# Patient Record
Sex: Female | Born: 1970 | Race: Black or African American | Hispanic: No | Marital: Single | Smoking: Never smoker
Health system: Southern US, Community
[De-identification: ages and names within clinical notes are randomized; demographics above are authoritative.]

## PROBLEM LIST (undated history)

## (undated) DIAGNOSIS — J309 Allergic rhinitis, unspecified: Secondary | ICD-10-CM

## (undated) DIAGNOSIS — I10 Essential (primary) hypertension: Secondary | ICD-10-CM

## (undated) DIAGNOSIS — E785 Hyperlipidemia, unspecified: Secondary | ICD-10-CM

## (undated) DIAGNOSIS — T7840XA Allergy, unspecified, initial encounter: Secondary | ICD-10-CM

## (undated) DIAGNOSIS — G4730 Sleep apnea, unspecified: Secondary | ICD-10-CM

## (undated) DIAGNOSIS — R600 Localized edema: Secondary | ICD-10-CM

## (undated) DIAGNOSIS — Z6841 Body Mass Index (BMI) 40.0 and over, adult: Secondary | ICD-10-CM

## (undated) DIAGNOSIS — R739 Hyperglycemia, unspecified: Secondary | ICD-10-CM

## (undated) DIAGNOSIS — R011 Cardiac murmur, unspecified: Secondary | ICD-10-CM

## (undated) DIAGNOSIS — E7800 Pure hypercholesterolemia, unspecified: Secondary | ICD-10-CM

## (undated) HISTORY — DX: Essential (primary) hypertension: I10

## (undated) HISTORY — DX: Cardiac murmur, unspecified: R01.1

## (undated) HISTORY — PX: TONSILLECTOMY: SUR1361

## (undated) HISTORY — DX: Hyperlipidemia, unspecified: E78.5

## (undated) HISTORY — DX: Hyperglycemia, unspecified: R73.9

## (undated) HISTORY — DX: Morbid (severe) obesity due to excess calories: E66.01

## (undated) HISTORY — DX: Localized edema: R60.0

## (undated) HISTORY — DX: Allergy, unspecified, initial encounter: T78.40XA

## (undated) HISTORY — DX: Pure hypercholesterolemia, unspecified: E78.00

## (undated) HISTORY — PX: UTERINE FIBROID SURGERY: SHX826

## (undated) HISTORY — PX: BREAST LUMPECTOMY: SHX2

## (undated) HISTORY — DX: Body Mass Index (BMI) 40.0 and over, adult: Z684

## (undated) HISTORY — PX: PARTIAL HYSTERECTOMY: SHX80

## (undated) HISTORY — DX: Allergic rhinitis, unspecified: J30.9

## (undated) HISTORY — DX: Sleep apnea, unspecified: G47.30

---

## 1999-05-17 ENCOUNTER — Ambulatory Visit (HOSPITAL_COMMUNITY): Admission: RE | Admit: 1999-05-17 | Discharge: 1999-05-17 | Payer: Self-pay | Admitting: General Surgery

## 1999-05-17 ENCOUNTER — Encounter (INDEPENDENT_AMBULATORY_CARE_PROVIDER_SITE_OTHER): Payer: Self-pay

## 1999-05-17 ENCOUNTER — Encounter (HOSPITAL_BASED_OUTPATIENT_CLINIC_OR_DEPARTMENT_OTHER): Payer: Self-pay | Admitting: General Surgery

## 1999-11-29 ENCOUNTER — Encounter (HOSPITAL_BASED_OUTPATIENT_CLINIC_OR_DEPARTMENT_OTHER): Payer: Self-pay | Admitting: General Surgery

## 1999-11-29 ENCOUNTER — Encounter: Admission: RE | Admit: 1999-11-29 | Discharge: 1999-11-29 | Payer: Self-pay | Admitting: General Surgery

## 1999-12-22 ENCOUNTER — Encounter: Admission: RE | Admit: 1999-12-22 | Discharge: 1999-12-22 | Payer: Self-pay | Admitting: General Surgery

## 2001-10-01 ENCOUNTER — Encounter (HOSPITAL_BASED_OUTPATIENT_CLINIC_OR_DEPARTMENT_OTHER): Payer: Self-pay | Admitting: General Surgery

## 2001-10-01 ENCOUNTER — Encounter: Admission: RE | Admit: 2001-10-01 | Discharge: 2001-10-01 | Payer: Self-pay | Admitting: General Surgery

## 2002-03-26 ENCOUNTER — Encounter: Admission: RE | Admit: 2002-03-26 | Discharge: 2002-03-26 | Payer: Self-pay | Admitting: General Surgery

## 2002-03-26 ENCOUNTER — Encounter (HOSPITAL_BASED_OUTPATIENT_CLINIC_OR_DEPARTMENT_OTHER): Payer: Self-pay | Admitting: General Surgery

## 2002-04-01 ENCOUNTER — Encounter: Admission: RE | Admit: 2002-04-01 | Discharge: 2002-04-01 | Payer: Self-pay | Admitting: General Surgery

## 2002-04-01 ENCOUNTER — Encounter (HOSPITAL_BASED_OUTPATIENT_CLINIC_OR_DEPARTMENT_OTHER): Payer: Self-pay | Admitting: General Surgery

## 2002-04-15 ENCOUNTER — Encounter (HOSPITAL_BASED_OUTPATIENT_CLINIC_OR_DEPARTMENT_OTHER): Payer: Self-pay | Admitting: General Surgery

## 2002-04-15 ENCOUNTER — Encounter: Admission: RE | Admit: 2002-04-15 | Discharge: 2002-04-15 | Payer: Self-pay | Admitting: General Surgery

## 2004-06-08 ENCOUNTER — Encounter: Admission: RE | Admit: 2004-06-08 | Discharge: 2004-06-08 | Payer: Self-pay | Admitting: General Surgery

## 2005-06-26 ENCOUNTER — Encounter: Admission: RE | Admit: 2005-06-26 | Discharge: 2005-06-26 | Payer: Self-pay | Admitting: Obstetrics & Gynecology

## 2006-07-08 ENCOUNTER — Encounter: Admission: RE | Admit: 2006-07-08 | Discharge: 2006-07-08 | Payer: Self-pay | Admitting: Family Medicine

## 2006-07-17 ENCOUNTER — Encounter: Admission: RE | Admit: 2006-07-17 | Discharge: 2006-07-17 | Payer: Self-pay | Admitting: Family Medicine

## 2007-01-01 ENCOUNTER — Encounter: Admission: RE | Admit: 2007-01-01 | Discharge: 2007-01-01 | Payer: Self-pay | Admitting: Family Medicine

## 2008-07-02 ENCOUNTER — Encounter: Admission: RE | Admit: 2008-07-02 | Discharge: 2008-07-02 | Payer: Self-pay | Admitting: Family Medicine

## 2009-07-04 ENCOUNTER — Encounter: Admission: RE | Admit: 2009-07-04 | Discharge: 2009-07-04 | Payer: Self-pay | Admitting: Family Medicine

## 2010-03-12 ENCOUNTER — Encounter: Payer: Self-pay | Admitting: Family Medicine

## 2010-06-29 ENCOUNTER — Other Ambulatory Visit: Payer: Self-pay | Admitting: Family Medicine

## 2010-06-29 DIAGNOSIS — Z1231 Encounter for screening mammogram for malignant neoplasm of breast: Secondary | ICD-10-CM

## 2010-07-18 ENCOUNTER — Ambulatory Visit: Payer: Self-pay

## 2010-07-18 ENCOUNTER — Ambulatory Visit
Admission: RE | Admit: 2010-07-18 | Discharge: 2010-07-18 | Disposition: A | Discharge status: Home / Self Care | Payer: PRIVATE HEALTH INSURANCE | Source: Ambulatory Visit | Attending: Family Medicine | Admitting: Family Medicine

## 2010-07-18 DIAGNOSIS — Z1231 Encounter for screening mammogram for malignant neoplasm of breast: Secondary | ICD-10-CM

## 2011-04-06 ENCOUNTER — Other Ambulatory Visit: Payer: Self-pay | Admitting: Family Medicine

## 2011-04-06 DIAGNOSIS — N6310 Unspecified lump in the right breast, unspecified quadrant: Secondary | ICD-10-CM

## 2011-04-09 ENCOUNTER — Ambulatory Visit
Admission: RE | Admit: 2011-04-09 | Discharge: 2011-04-09 | Disposition: A | Discharge status: Home / Self Care | Payer: PRIVATE HEALTH INSURANCE | Source: Ambulatory Visit | Attending: Family Medicine | Admitting: Family Medicine

## 2011-04-09 DIAGNOSIS — N6310 Unspecified lump in the right breast, unspecified quadrant: Secondary | ICD-10-CM

## 2011-05-04 ENCOUNTER — Other Ambulatory Visit: Payer: Self-pay | Admitting: Family Medicine

## 2011-05-04 DIAGNOSIS — N6310 Unspecified lump in the right breast, unspecified quadrant: Secondary | ICD-10-CM

## 2011-05-17 ENCOUNTER — Other Ambulatory Visit: Payer: Self-pay | Admitting: Family Medicine

## 2011-05-17 ENCOUNTER — Ambulatory Visit
Admission: RE | Admit: 2011-05-17 | Discharge: 2011-05-17 | Disposition: A | Discharge status: Home / Self Care | Payer: PRIVATE HEALTH INSURANCE | Source: Ambulatory Visit | Attending: Family Medicine | Admitting: Family Medicine

## 2011-05-17 DIAGNOSIS — N6310 Unspecified lump in the right breast, unspecified quadrant: Secondary | ICD-10-CM

## 2011-07-12 ENCOUNTER — Other Ambulatory Visit: Payer: Self-pay | Admitting: Family Medicine

## 2011-07-12 DIAGNOSIS — Z1231 Encounter for screening mammogram for malignant neoplasm of breast: Secondary | ICD-10-CM

## 2011-07-27 ENCOUNTER — Ambulatory Visit: Payer: PRIVATE HEALTH INSURANCE

## 2011-07-31 ENCOUNTER — Ambulatory Visit: Payer: PRIVATE HEALTH INSURANCE

## 2011-08-03 ENCOUNTER — Ambulatory Visit: Payer: PRIVATE HEALTH INSURANCE

## 2011-08-10 ENCOUNTER — Ambulatory Visit
Admission: RE | Admit: 2011-08-10 | Discharge: 2011-08-10 | Disposition: A | Discharge status: Home / Self Care | Payer: PRIVATE HEALTH INSURANCE | Source: Ambulatory Visit | Attending: Family Medicine | Admitting: Family Medicine

## 2011-08-10 DIAGNOSIS — Z1231 Encounter for screening mammogram for malignant neoplasm of breast: Secondary | ICD-10-CM

## 2012-08-21 ENCOUNTER — Other Ambulatory Visit: Payer: Self-pay

## 2012-08-21 DIAGNOSIS — Z1231 Encounter for screening mammogram for malignant neoplasm of breast: Secondary | ICD-10-CM

## 2012-09-16 ENCOUNTER — Ambulatory Visit
Admission: RE | Admit: 2012-09-16 | Discharge: 2012-09-16 | Disposition: A | Discharge status: Home / Self Care | Payer: 59 | Source: Ambulatory Visit

## 2012-09-16 DIAGNOSIS — Z1231 Encounter for screening mammogram for malignant neoplasm of breast: Secondary | ICD-10-CM

## 2012-09-18 ENCOUNTER — Other Ambulatory Visit: Payer: Self-pay | Admitting: Family Medicine

## 2012-09-18 DIAGNOSIS — R928 Other abnormal and inconclusive findings on diagnostic imaging of breast: Secondary | ICD-10-CM

## 2012-09-23 ENCOUNTER — Ambulatory Visit
Admission: RE | Admit: 2012-09-23 | Discharge: 2012-09-23 | Disposition: A | Discharge status: Home / Self Care | Payer: 59 | Source: Ambulatory Visit | Attending: Family Medicine | Admitting: Family Medicine

## 2012-09-23 DIAGNOSIS — R928 Other abnormal and inconclusive findings on diagnostic imaging of breast: Secondary | ICD-10-CM

## 2012-11-12 ENCOUNTER — Other Ambulatory Visit: Payer: Self-pay | Admitting: Family Medicine

## 2012-11-12 DIAGNOSIS — N63 Unspecified lump in unspecified breast: Secondary | ICD-10-CM

## 2012-11-27 ENCOUNTER — Ambulatory Visit
Admission: RE | Admit: 2012-11-27 | Discharge: 2012-11-27 | Disposition: A | Discharge status: Home / Self Care | Payer: Self-pay | Source: Ambulatory Visit | Attending: Family Medicine | Admitting: Family Medicine

## 2012-11-27 ENCOUNTER — Other Ambulatory Visit: Payer: Self-pay | Admitting: Family Medicine

## 2012-11-27 DIAGNOSIS — N63 Unspecified lump in unspecified breast: Secondary | ICD-10-CM

## 2013-08-24 ENCOUNTER — Other Ambulatory Visit: Payer: Self-pay

## 2013-08-24 DIAGNOSIS — Z1231 Encounter for screening mammogram for malignant neoplasm of breast: Secondary | ICD-10-CM

## 2013-09-17 ENCOUNTER — Ambulatory Visit
Admission: RE | Admit: 2013-09-17 | Discharge: 2013-09-17 | Disposition: A | Discharge status: Home / Self Care | Payer: BC Managed Care – PPO | Source: Ambulatory Visit

## 2013-09-17 DIAGNOSIS — Z1231 Encounter for screening mammogram for malignant neoplasm of breast: Secondary | ICD-10-CM

## 2013-09-22 ENCOUNTER — Other Ambulatory Visit: Payer: Self-pay | Admitting: Family Medicine

## 2013-09-22 DIAGNOSIS — R928 Other abnormal and inconclusive findings on diagnostic imaging of breast: Secondary | ICD-10-CM

## 2013-09-30 ENCOUNTER — Other Ambulatory Visit: Payer: BC Managed Care – PPO

## 2013-10-01 ENCOUNTER — Other Ambulatory Visit: Payer: BC Managed Care – PPO

## 2013-10-02 ENCOUNTER — Ambulatory Visit
Admission: RE | Admit: 2013-10-02 | Discharge: 2013-10-02 | Disposition: A | Discharge status: Home / Self Care | Payer: BC Managed Care – PPO | Source: Ambulatory Visit | Attending: Family Medicine | Admitting: Family Medicine

## 2013-10-02 DIAGNOSIS — R928 Other abnormal and inconclusive findings on diagnostic imaging of breast: Secondary | ICD-10-CM

## 2014-02-24 ENCOUNTER — Other Ambulatory Visit: Payer: Self-pay | Admitting: Family Medicine

## 2014-02-24 DIAGNOSIS — N63 Unspecified lump in unspecified breast: Secondary | ICD-10-CM

## 2014-04-05 ENCOUNTER — Other Ambulatory Visit: Payer: Self-pay

## 2014-04-08 ENCOUNTER — Ambulatory Visit
Admission: RE | Admit: 2014-04-08 | Discharge: 2014-04-08 | Disposition: A | Discharge status: Home / Self Care | Payer: BLUE CROSS/BLUE SHIELD | Source: Ambulatory Visit | Attending: Family Medicine | Admitting: Family Medicine

## 2014-04-08 DIAGNOSIS — N63 Unspecified lump in unspecified breast: Secondary | ICD-10-CM

## 2014-04-12 ENCOUNTER — Other Ambulatory Visit: Payer: Self-pay

## 2014-09-21 ENCOUNTER — Other Ambulatory Visit: Payer: Self-pay

## 2014-09-21 ENCOUNTER — Other Ambulatory Visit: Payer: Self-pay | Admitting: Family Medicine

## 2014-09-21 DIAGNOSIS — N63 Unspecified lump in unspecified breast: Secondary | ICD-10-CM

## 2014-09-30 IMAGING — MG MM DIGITAL DIAGNOSTIC UNILAT*R*
2 series · 2 of 2 positions shown · non-contrast
Comparison: Previous exams

CLINICAL DATA: Status post ultrasound-guided core biopsy of right
breast mass, 8 o'clock location. Clip placement images.

EXAM:
DIGITAL DIAGNOSTIC UNILATERAL RIGHT MAMMOGRAM

[R ML]
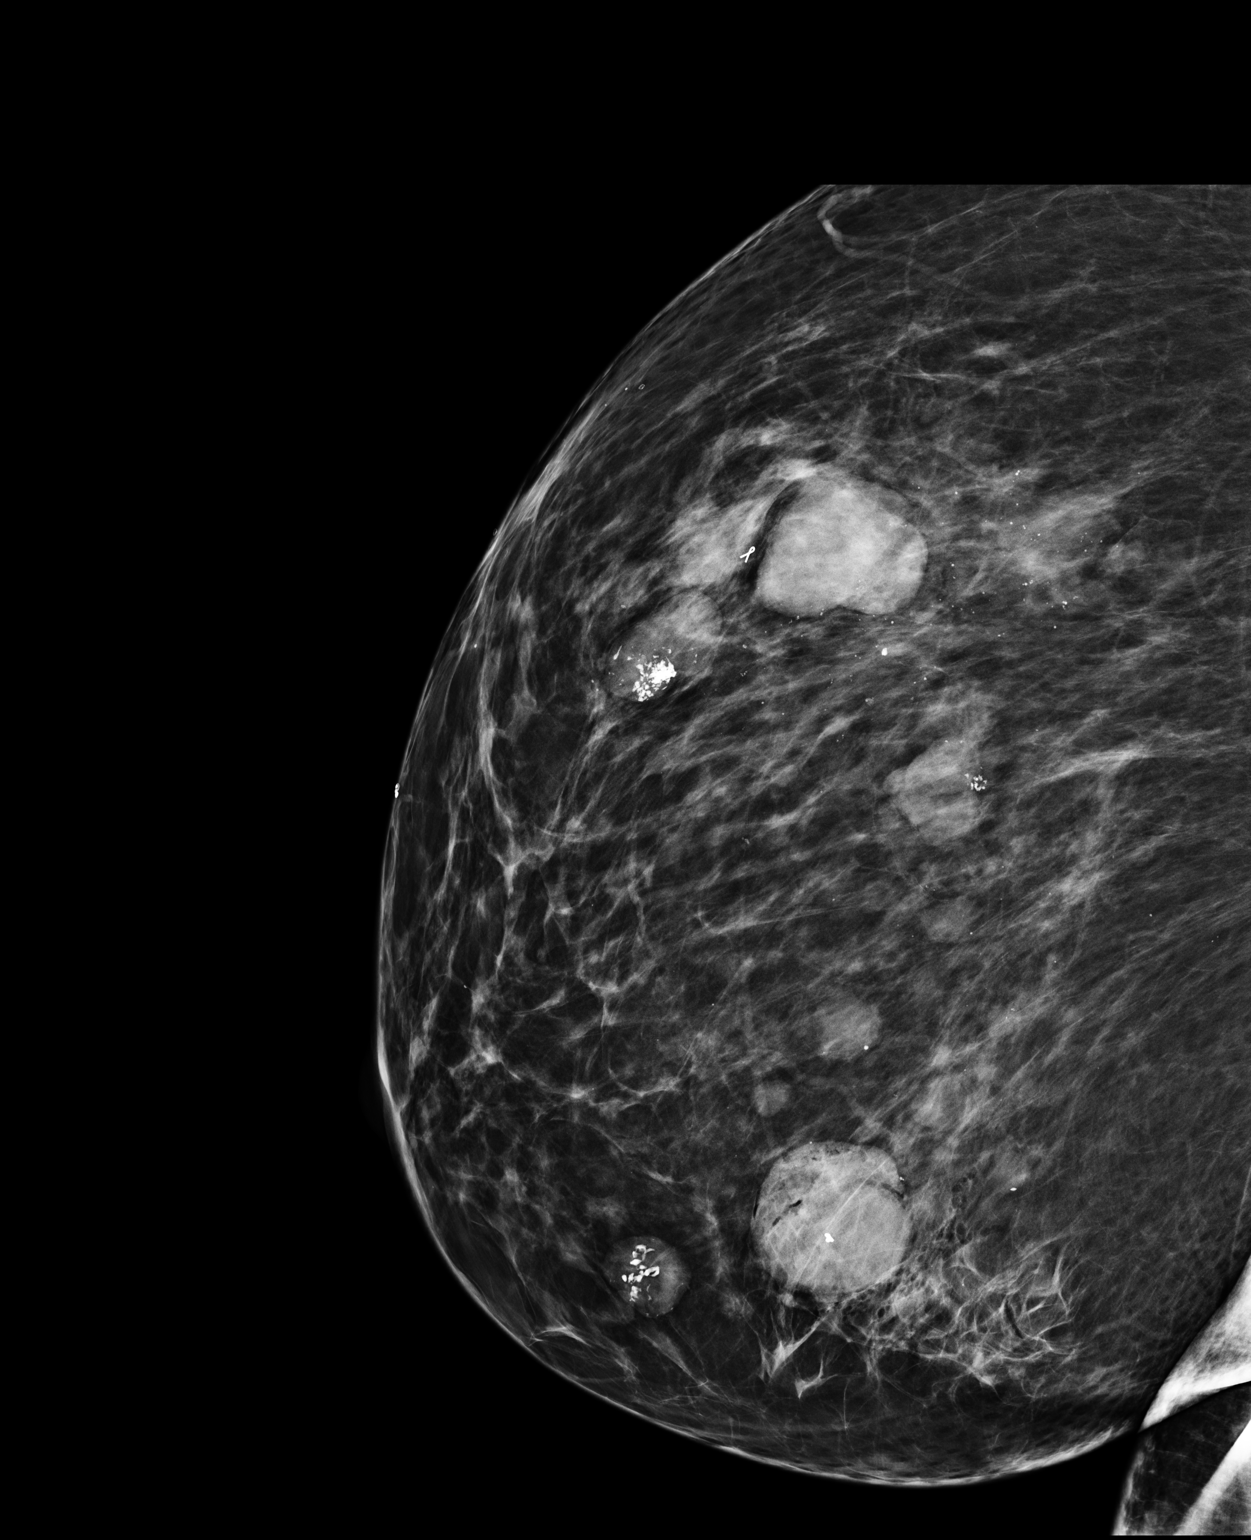

[R CC]
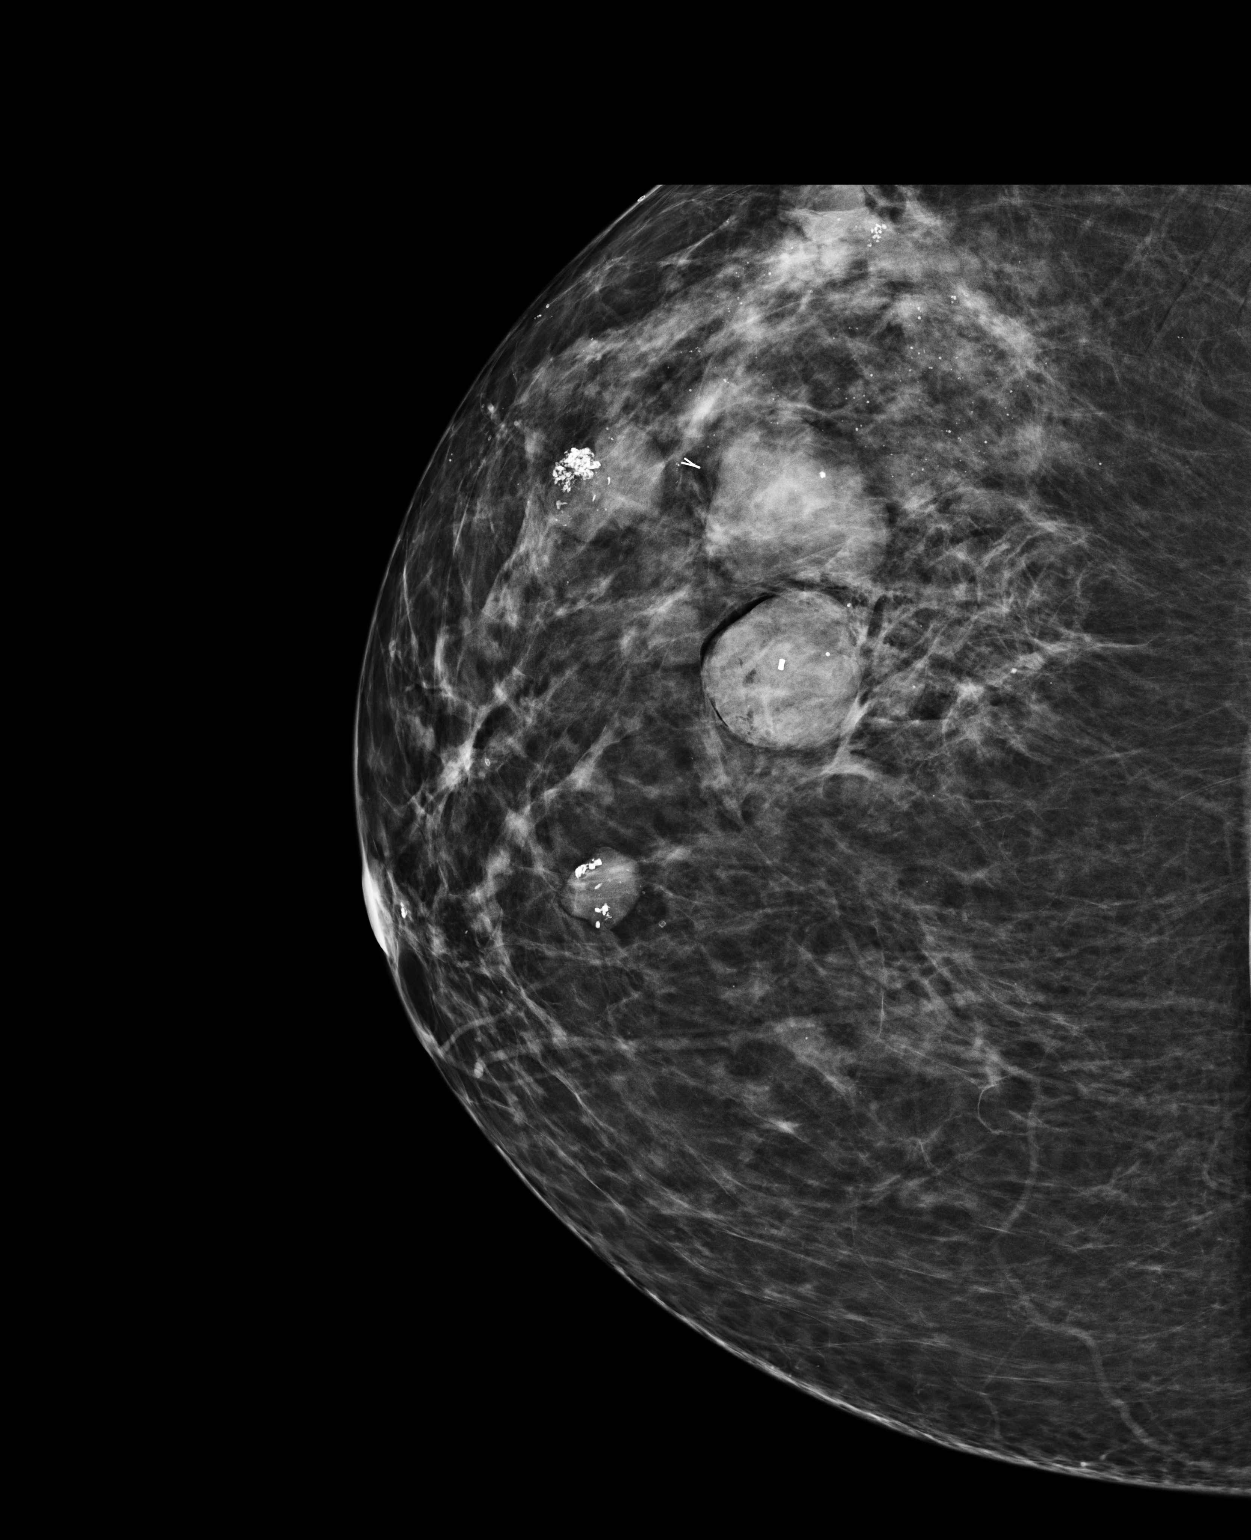

[2 of 2 positions shown; findings below may reference images not displayed]

FINDINGS: Mammographic images were obtained following ultrasound guided biopsy
of mass in the o'clock location of the right breast. A top hat
shaped clip is identified within the mass in the lower outer
quadrant of the right breast..
IMPRESSION: Tissue marker clip is in expected location following biopsy.  Clear

Final Assessment: Post Procedure Mammograms for Marker Placement

## 2014-10-01 ENCOUNTER — Other Ambulatory Visit: Payer: BLUE CROSS/BLUE SHIELD

## 2015-06-09 ENCOUNTER — Other Ambulatory Visit: Payer: Self-pay

## 2015-06-09 DIAGNOSIS — D242 Benign neoplasm of left breast: Secondary | ICD-10-CM | POA: Diagnosis not present

## 2015-06-09 DIAGNOSIS — N63 Unspecified lump in breast: Secondary | ICD-10-CM | POA: Diagnosis not present

## 2015-06-14 DIAGNOSIS — F209 Schizophrenia, unspecified: Secondary | ICD-10-CM | POA: Diagnosis not present

## 2015-07-12 DIAGNOSIS — F209 Schizophrenia, unspecified: Secondary | ICD-10-CM | POA: Diagnosis not present

## 2015-08-05 IMAGING — US US BREAST LTD UNI RIGHT INC AXILLA
1 series · 5 of 5 positions shown · non-contrast
Comparison: Multiple priors, most recent studies include screening
mammogram/tomogram 09/17/2013, 11/27/2012 post clip mammogram of the
right breast, right breast ultrasound-guided biopsy, and 09/16/2012
screening mammogram/tomogram.

CLINICAL DATA: Patient recently had screening mammogram/ tomogram
dated 09/17/2013. A mass in 8 o'clock position of the right breast
biopsied in November 2012 inch shown to be a fibroadenoma at
pathology contains a biopsy clip centrally. This mass appears larger
on the current screening mammogram/tomogram dated 09/17/2013
compared to the mammogram August 2012. The patient states she has
noticed that this mass has enlarged since it was biopsied.

[Series 1: us breast ltd uni right inc axilla · 5 of 5 slices shown]
[im 1/5]
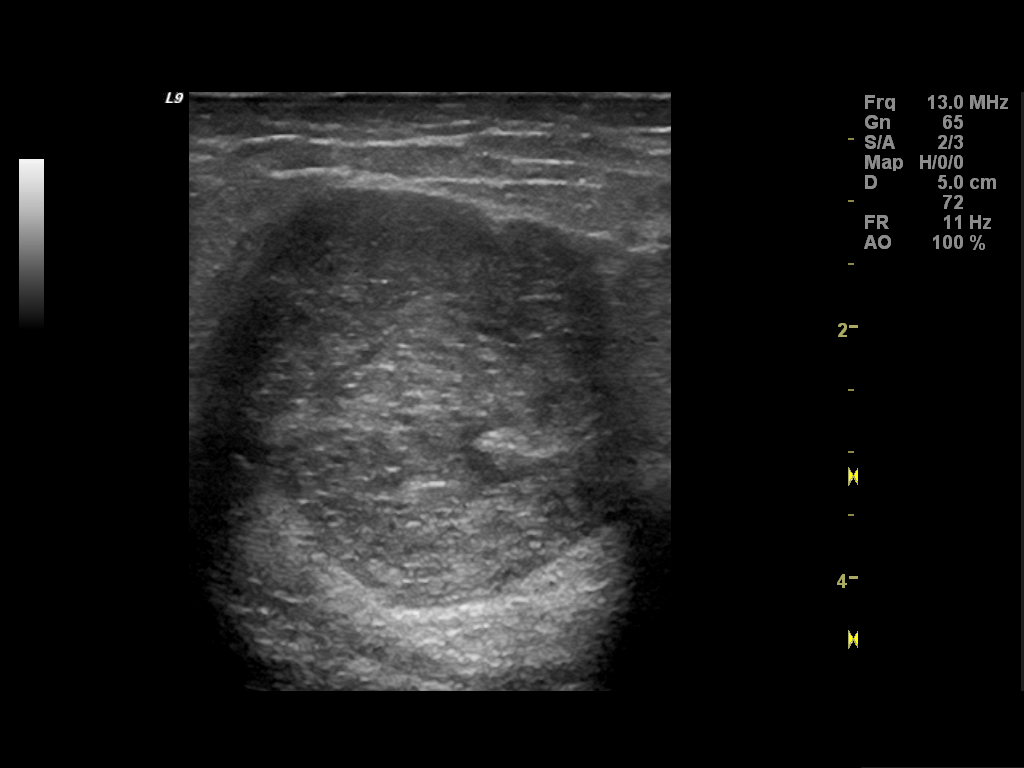
[im 2/5]
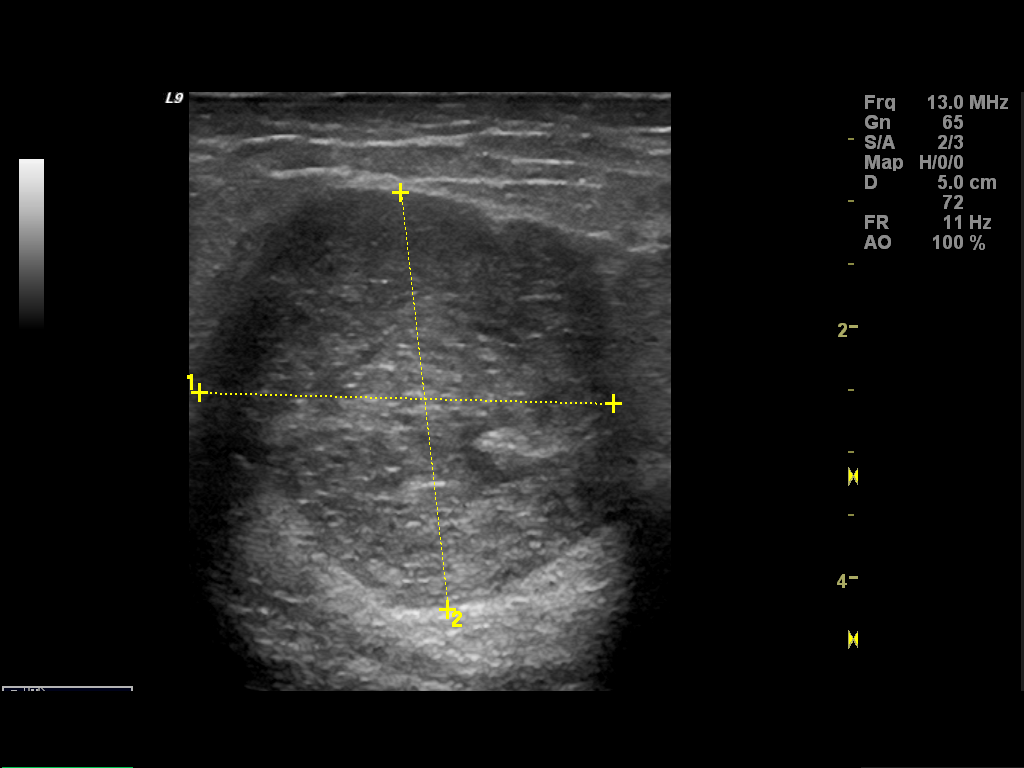
[im 3/5]
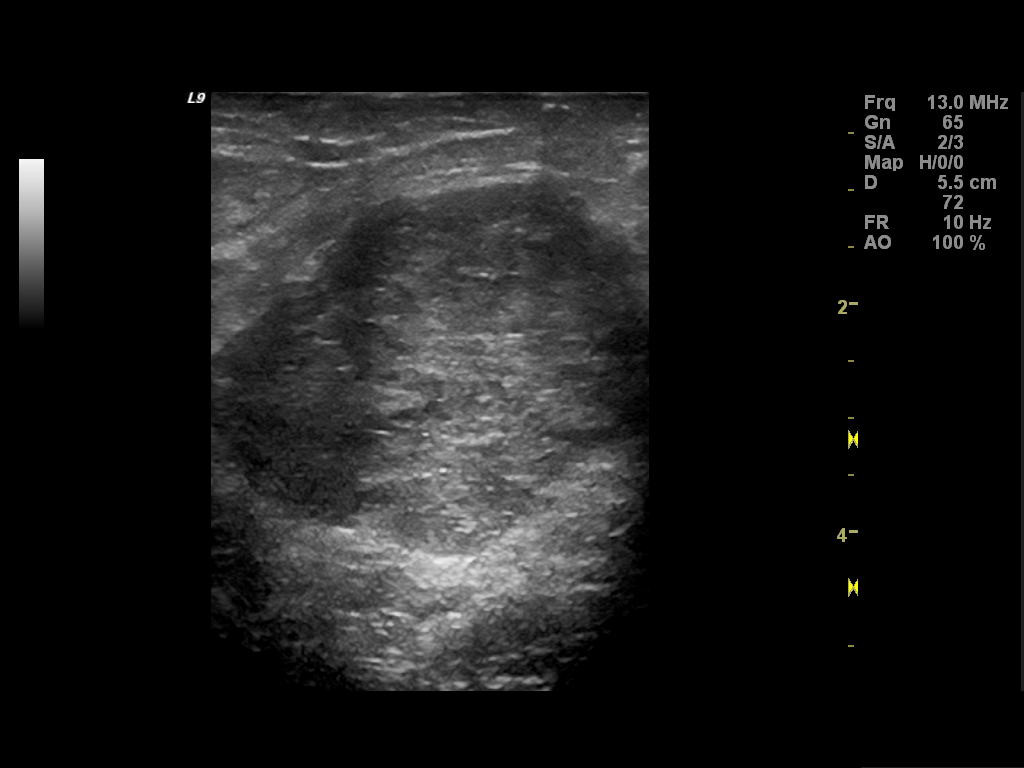
[im 4/5]
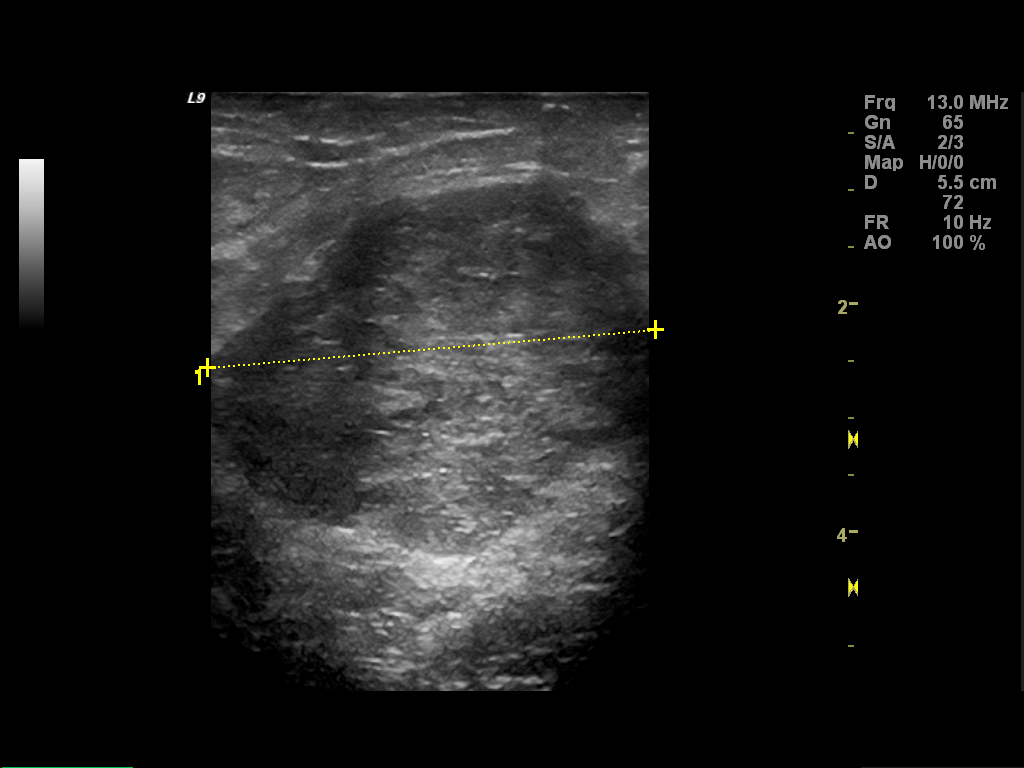
[im 5/5]
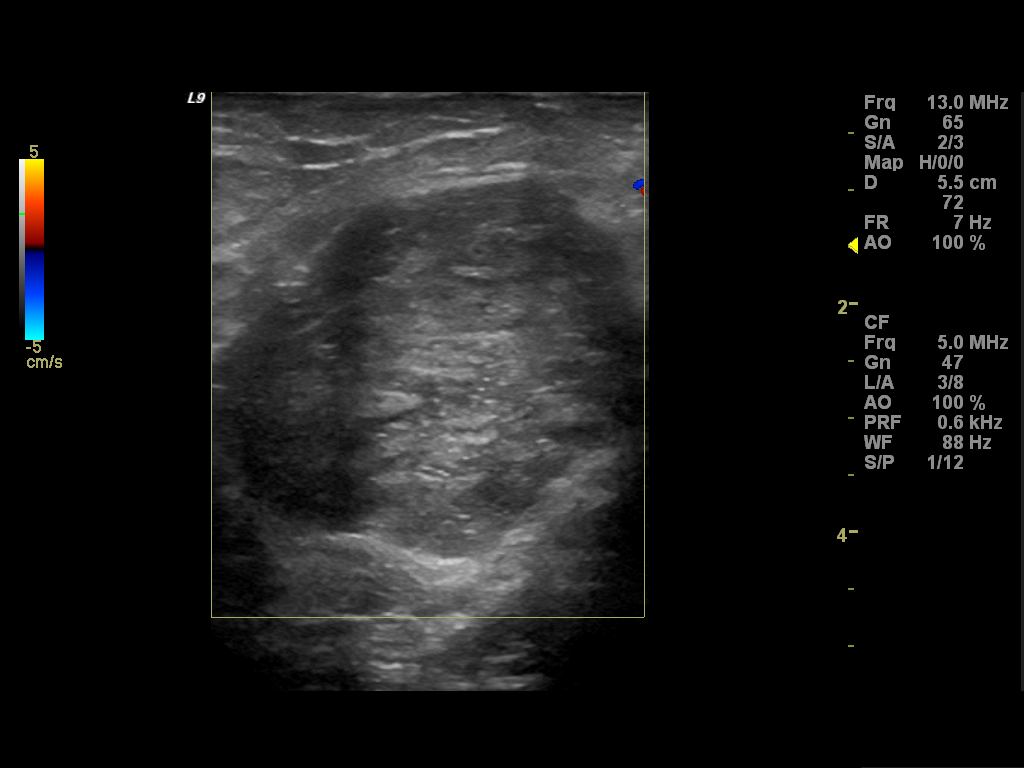

[5 of 5 positions shown; findings below may reference images not displayed]

Additionally, there is a new mass in the central left breast,
posterior third, for which additional evaluation is requested.

Patient's recent screening mammogram/tomogram also shows multiple
bilateral masses that have associated markedly dystrophic
calcifications, consistent with a degenerating fibroadenomas.

She had a right breast mass biopsied at the 11 o'clock position in
March 2011, also revealing a fibroadenoma.

Discussion with the patient today, she reports a history of multiple
fibroadenomas, beginning when she was teenager. She states she has
had fibroadenomas surgically excised from each breast, in her 20s to
early 30s.

EXAM:
ULTRASOUND OF THE BILATERAL BREAST
FINDINGS: On physical exam, I palpate a mobile mass at 8 o'clock position
approximately 6 cm from the nipple. No mass is palpated in the
central left breast.

Ultrasound is performed, showing a circumscribed slightly
hypoechoic, homogeneous, mass that is gently lobulated in the 8
o'clock position of the right breast 6 cm from the nipple. The mass
measures 3.9 x 3.3 x 3.3 cm on ultrasound. On the recent screening
mammogram/tomogram, this mass remains circumscribed and contains
central biopsy clip artifact. The mass has enlarged. By ultrasound
performed 11/27/2012 it measured 2.1 x 1.9 x 1.8 cm. No significant
vascular flow is seen within the mass on today's ultrasound. Imaging
features remain most compatible with a fibroadenoma.

Ultrasound of the 12 o'clock retroareolar position left breast shows
a hypoechoic circumscribed oval mass measuring 2.8 x 0.9 x 2.3 cm.
This has imaging findings most compatible with a benign
fibroadenoma. On the recent screening mammogram/ tomogram, it has
similar appearances to the patient's multiple bilateral
circumscribed masses.
IMPRESSION: 1. The patient has a biopsy-proven fibroadenoma in the 8 o'clock
position of the right breast that has enlarged since the biopsy November 2012. The patient has an extensive history of bilateral
fibroadenomas. [place] was discussed with the patient today that this
is most likely of benign fibroadenoma that has enlarged in the
interval. The possibility of surgical excision was discussed with
the patient, but she prefers ultrasound followup.
2. New circumscribed oval mass 12 o'clock retroareolar left breast
has imaging features compatible with benign fibroadenoma. Six-month
followup left breast ultrasound is recommended.

RECOMMENDATION:
Bilateral breast ultrasound in 6 months is recommended.

I have discussed the findings and recommendations with the patient.
Results were also provided in writing at the conclusion of the
visit. If applicable, a reminder letter will be sent to the patient
regarding the next appointment.

BI-RADS CATEGORY  3: Probably benign.

## 2015-08-09 DIAGNOSIS — F209 Schizophrenia, unspecified: Secondary | ICD-10-CM | POA: Diagnosis not present

## 2015-09-06 DIAGNOSIS — F209 Schizophrenia, unspecified: Secondary | ICD-10-CM | POA: Diagnosis not present

## 2015-09-26 DIAGNOSIS — D649 Anemia, unspecified: Secondary | ICD-10-CM | POA: Diagnosis not present

## 2015-09-26 DIAGNOSIS — I10 Essential (primary) hypertension: Secondary | ICD-10-CM | POA: Diagnosis not present

## 2015-09-26 DIAGNOSIS — Z79899 Other long term (current) drug therapy: Secondary | ICD-10-CM | POA: Diagnosis not present

## 2015-09-26 DIAGNOSIS — E7800 Pure hypercholesterolemia, unspecified: Secondary | ICD-10-CM | POA: Diagnosis not present

## 2015-09-29 DIAGNOSIS — I10 Essential (primary) hypertension: Secondary | ICD-10-CM | POA: Diagnosis not present

## 2015-09-29 DIAGNOSIS — F209 Schizophrenia, unspecified: Secondary | ICD-10-CM | POA: Diagnosis not present

## 2015-09-29 DIAGNOSIS — E7800 Pure hypercholesterolemia, unspecified: Secondary | ICD-10-CM | POA: Diagnosis not present

## 2015-10-04 DIAGNOSIS — F209 Schizophrenia, unspecified: Secondary | ICD-10-CM | POA: Diagnosis not present

## 2015-10-13 DIAGNOSIS — Z1231 Encounter for screening mammogram for malignant neoplasm of breast: Secondary | ICD-10-CM | POA: Diagnosis not present

## 2015-11-01 DIAGNOSIS — F209 Schizophrenia, unspecified: Secondary | ICD-10-CM | POA: Diagnosis not present

## 2015-11-29 DIAGNOSIS — F209 Schizophrenia, unspecified: Secondary | ICD-10-CM | POA: Diagnosis not present

## 2015-12-12 DIAGNOSIS — D242 Benign neoplasm of left breast: Secondary | ICD-10-CM | POA: Diagnosis not present

## 2015-12-12 DIAGNOSIS — R921 Mammographic calcification found on diagnostic imaging of breast: Secondary | ICD-10-CM | POA: Diagnosis not present

## 2015-12-12 DIAGNOSIS — D241 Benign neoplasm of right breast: Secondary | ICD-10-CM | POA: Diagnosis not present

## 2015-12-27 DIAGNOSIS — F209 Schizophrenia, unspecified: Secondary | ICD-10-CM | POA: Diagnosis not present

## 2016-01-05 DIAGNOSIS — I10 Essential (primary) hypertension: Secondary | ICD-10-CM | POA: Diagnosis not present

## 2016-01-05 DIAGNOSIS — Z0000 Encounter for general adult medical examination without abnormal findings: Secondary | ICD-10-CM | POA: Diagnosis not present

## 2016-01-05 DIAGNOSIS — E7800 Pure hypercholesterolemia, unspecified: Secondary | ICD-10-CM | POA: Diagnosis not present

## 2016-01-10 DIAGNOSIS — Z23 Encounter for immunization: Secondary | ICD-10-CM | POA: Diagnosis not present

## 2016-01-24 DIAGNOSIS — F209 Schizophrenia, unspecified: Secondary | ICD-10-CM | POA: Diagnosis not present

## 2016-02-09 IMAGING — US US BREAST LTD UNI RIGHT INC AXILLA
1 series · 4 of 4 positions shown · non-contrast
Comparison: Previous exam(s).

CLINICAL DATA: Six-month followup bilateral masses

EXAM:
ULTRASOUND OF THE BILATERAL BREAST

[Series 1: us breast ltd uni right inc axilla · 4 of 4 slices shown]
[im 1/4]
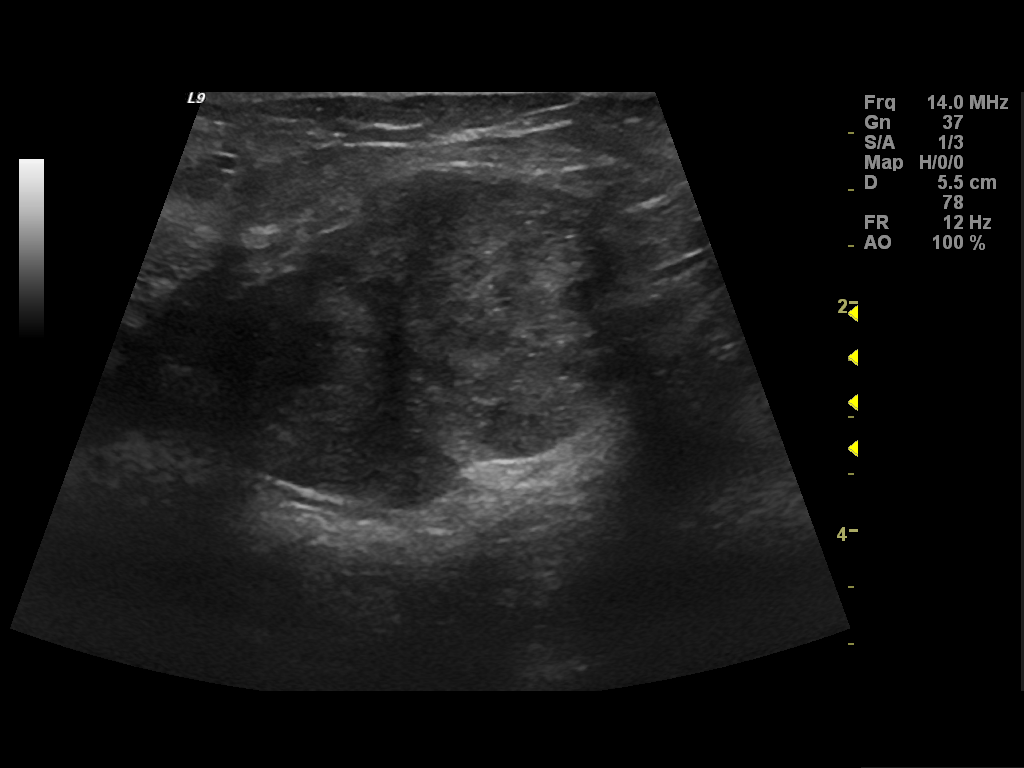
[im 2/4]
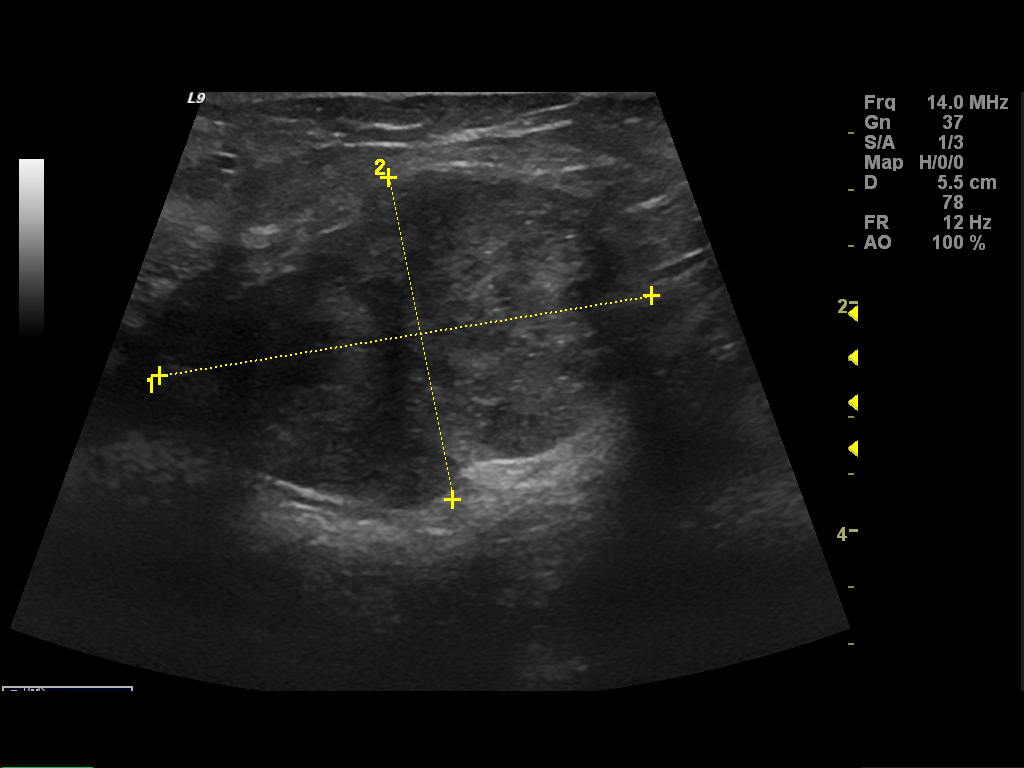
[im 3/4]
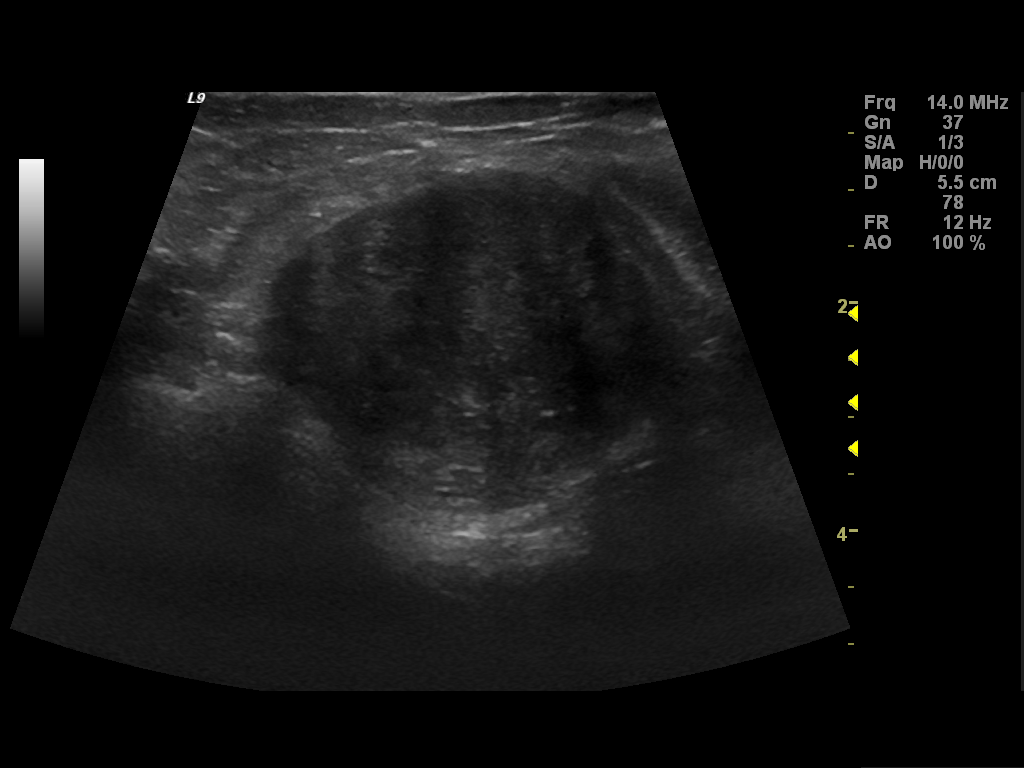
[im 4/4]
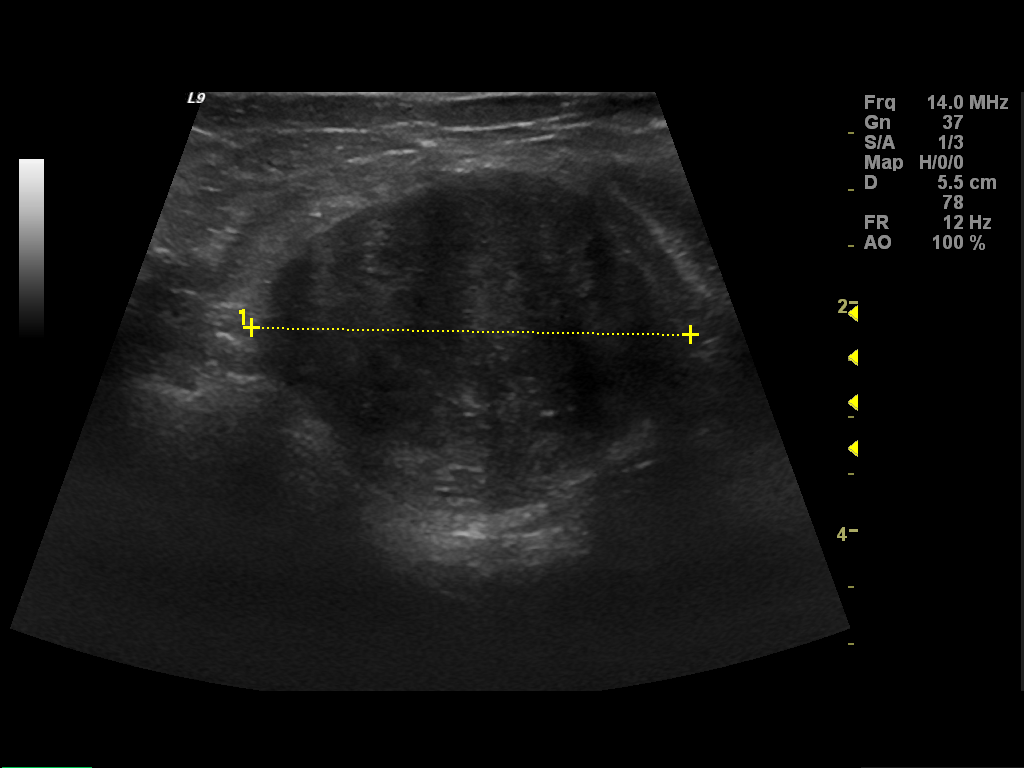

[4 of 4 positions shown; findings below may reference images not displayed]

FINDINGS: Targeted ultrasound is performed, showing 4.37 x 2.9 x 3.9 cm oval
hypoechoic lesion at the right breast 8 o'clock 5 cm from nipple
enlarged compared to prior ultrasound. This mass has been previously
biopsied but based on the mammograms has continued to enlarge since
1276.

Ultrasound of the left breast demonstrate 2.1 x 1.4 x 2 cm oval
hypoechoic lesion at the left breast 12 o'clock 1 cm from nipple not
significantly changed compared to prior ultrasound September 2013.
IMPRESSION: Suspicious findings.

RECOMMENDATION:
Surgical removal/excision of the right breast 8 o'clock mass due to
the growth of the mass. Also recommend six-month follow-up
ultrasound of the left breast 12 o'clock mass.

I have discussed the findings and recommendations with the patient.
Results were also provided in writing at the conclusion of the
visit. If applicable, a reminder letter will be sent to the patient
regarding the next appointment.

BI-RADS CATEGORY  4: Suspicious abnormality - biopsy should be
considered.

## 2016-02-21 DIAGNOSIS — F209 Schizophrenia, unspecified: Secondary | ICD-10-CM | POA: Diagnosis not present

## 2016-03-20 DIAGNOSIS — F209 Schizophrenia, unspecified: Secondary | ICD-10-CM | POA: Diagnosis not present

## 2016-04-17 DIAGNOSIS — F209 Schizophrenia, unspecified: Secondary | ICD-10-CM | POA: Diagnosis not present

## 2016-05-15 DIAGNOSIS — F209 Schizophrenia, unspecified: Secondary | ICD-10-CM | POA: Diagnosis not present

## 2016-05-23 DIAGNOSIS — E7800 Pure hypercholesterolemia, unspecified: Secondary | ICD-10-CM | POA: Diagnosis not present

## 2016-05-23 DIAGNOSIS — Z79899 Other long term (current) drug therapy: Secondary | ICD-10-CM | POA: Diagnosis not present

## 2016-05-23 DIAGNOSIS — D649 Anemia, unspecified: Secondary | ICD-10-CM | POA: Diagnosis not present

## 2016-05-23 DIAGNOSIS — I10 Essential (primary) hypertension: Secondary | ICD-10-CM | POA: Diagnosis not present

## 2016-05-24 DIAGNOSIS — F209 Schizophrenia, unspecified: Secondary | ICD-10-CM | POA: Diagnosis not present

## 2016-05-24 DIAGNOSIS — E7800 Pure hypercholesterolemia, unspecified: Secondary | ICD-10-CM | POA: Diagnosis not present

## 2016-05-24 DIAGNOSIS — I10 Essential (primary) hypertension: Secondary | ICD-10-CM | POA: Diagnosis not present

## 2016-05-24 DIAGNOSIS — K219 Gastro-esophageal reflux disease without esophagitis: Secondary | ICD-10-CM | POA: Diagnosis not present

## 2016-06-12 DIAGNOSIS — F209 Schizophrenia, unspecified: Secondary | ICD-10-CM | POA: Diagnosis not present

## 2016-06-13 DIAGNOSIS — R928 Other abnormal and inconclusive findings on diagnostic imaging of breast: Secondary | ICD-10-CM | POA: Diagnosis not present

## 2016-06-28 DIAGNOSIS — Z90710 Acquired absence of both cervix and uterus: Secondary | ICD-10-CM | POA: Diagnosis not present

## 2016-07-10 DIAGNOSIS — F209 Schizophrenia, unspecified: Secondary | ICD-10-CM | POA: Diagnosis not present

## 2016-08-07 DIAGNOSIS — F209 Schizophrenia, unspecified: Secondary | ICD-10-CM | POA: Diagnosis not present

## 2016-09-04 DIAGNOSIS — F209 Schizophrenia, unspecified: Secondary | ICD-10-CM | POA: Diagnosis not present

## 2016-10-02 DIAGNOSIS — F209 Schizophrenia, unspecified: Secondary | ICD-10-CM | POA: Diagnosis not present

## 2016-10-17 DIAGNOSIS — R928 Other abnormal and inconclusive findings on diagnostic imaging of breast: Secondary | ICD-10-CM | POA: Diagnosis not present

## 2016-10-30 DIAGNOSIS — F209 Schizophrenia, unspecified: Secondary | ICD-10-CM | POA: Diagnosis not present

## 2016-11-27 DIAGNOSIS — F209 Schizophrenia, unspecified: Secondary | ICD-10-CM | POA: Diagnosis not present

## 2016-12-05 DIAGNOSIS — J302 Other seasonal allergic rhinitis: Secondary | ICD-10-CM | POA: Diagnosis not present

## 2016-12-05 DIAGNOSIS — J3089 Other allergic rhinitis: Secondary | ICD-10-CM | POA: Diagnosis not present

## 2016-12-25 DIAGNOSIS — F209 Schizophrenia, unspecified: Secondary | ICD-10-CM | POA: Diagnosis not present

## 2017-01-08 DIAGNOSIS — Z79899 Other long term (current) drug therapy: Secondary | ICD-10-CM | POA: Diagnosis not present

## 2017-01-08 DIAGNOSIS — I10 Essential (primary) hypertension: Secondary | ICD-10-CM | POA: Diagnosis not present

## 2017-01-08 DIAGNOSIS — E7800 Pure hypercholesterolemia, unspecified: Secondary | ICD-10-CM | POA: Diagnosis not present

## 2017-01-14 DIAGNOSIS — F209 Schizophrenia, unspecified: Secondary | ICD-10-CM | POA: Diagnosis not present

## 2017-01-14 DIAGNOSIS — E7800 Pure hypercholesterolemia, unspecified: Secondary | ICD-10-CM | POA: Diagnosis not present

## 2017-01-14 DIAGNOSIS — I10 Essential (primary) hypertension: Secondary | ICD-10-CM | POA: Diagnosis not present

## 2017-01-22 DIAGNOSIS — F209 Schizophrenia, unspecified: Secondary | ICD-10-CM | POA: Diagnosis not present

## 2017-02-20 DIAGNOSIS — F209 Schizophrenia, unspecified: Secondary | ICD-10-CM | POA: Diagnosis not present

## 2017-03-19 DIAGNOSIS — F209 Schizophrenia, unspecified: Secondary | ICD-10-CM | POA: Diagnosis not present

## 2017-04-16 DIAGNOSIS — F209 Schizophrenia, unspecified: Secondary | ICD-10-CM | POA: Diagnosis not present

## 2017-05-14 DIAGNOSIS — F209 Schizophrenia, unspecified: Secondary | ICD-10-CM | POA: Diagnosis not present

## 2017-06-04 DIAGNOSIS — K219 Gastro-esophageal reflux disease without esophagitis: Secondary | ICD-10-CM | POA: Diagnosis not present

## 2017-06-04 DIAGNOSIS — Z79899 Other long term (current) drug therapy: Secondary | ICD-10-CM | POA: Diagnosis not present

## 2017-06-04 DIAGNOSIS — I10 Essential (primary) hypertension: Secondary | ICD-10-CM | POA: Diagnosis not present

## 2017-06-04 DIAGNOSIS — E7800 Pure hypercholesterolemia, unspecified: Secondary | ICD-10-CM | POA: Diagnosis not present

## 2017-06-11 DIAGNOSIS — K219 Gastro-esophageal reflux disease without esophagitis: Secondary | ICD-10-CM | POA: Diagnosis not present

## 2017-06-11 DIAGNOSIS — Z79899 Other long term (current) drug therapy: Secondary | ICD-10-CM | POA: Diagnosis not present

## 2017-06-11 DIAGNOSIS — I10 Essential (primary) hypertension: Secondary | ICD-10-CM | POA: Diagnosis not present

## 2017-06-11 DIAGNOSIS — E7800 Pure hypercholesterolemia, unspecified: Secondary | ICD-10-CM | POA: Diagnosis not present

## 2017-07-09 DIAGNOSIS — F209 Schizophrenia, unspecified: Secondary | ICD-10-CM | POA: Diagnosis not present

## 2017-08-06 DIAGNOSIS — F209 Schizophrenia, unspecified: Secondary | ICD-10-CM | POA: Diagnosis not present

## 2017-09-03 DIAGNOSIS — F209 Schizophrenia, unspecified: Secondary | ICD-10-CM | POA: Diagnosis not present

## 2017-11-27 DIAGNOSIS — R928 Other abnormal and inconclusive findings on diagnostic imaging of breast: Secondary | ICD-10-CM | POA: Diagnosis not present

## 2017-11-27 DIAGNOSIS — R921 Mammographic calcification found on diagnostic imaging of breast: Secondary | ICD-10-CM | POA: Diagnosis not present

## 2017-11-27 DIAGNOSIS — Z23 Encounter for immunization: Secondary | ICD-10-CM | POA: Diagnosis not present

## 2017-12-10 DIAGNOSIS — Z79899 Other long term (current) drug therapy: Secondary | ICD-10-CM | POA: Diagnosis not present

## 2017-12-10 DIAGNOSIS — E7800 Pure hypercholesterolemia, unspecified: Secondary | ICD-10-CM | POA: Diagnosis not present

## 2017-12-10 DIAGNOSIS — I10 Essential (primary) hypertension: Secondary | ICD-10-CM | POA: Diagnosis not present

## 2017-12-12 DIAGNOSIS — E7800 Pure hypercholesterolemia, unspecified: Secondary | ICD-10-CM | POA: Diagnosis not present

## 2017-12-12 DIAGNOSIS — I10 Essential (primary) hypertension: Secondary | ICD-10-CM | POA: Diagnosis not present

## 2017-12-12 DIAGNOSIS — F259 Schizoaffective disorder, unspecified: Secondary | ICD-10-CM | POA: Diagnosis not present

## 2017-12-24 DIAGNOSIS — F259 Schizoaffective disorder, unspecified: Secondary | ICD-10-CM | POA: Diagnosis not present

## 2018-01-22 DIAGNOSIS — F209 Schizophrenia, unspecified: Secondary | ICD-10-CM | POA: Diagnosis not present

## 2018-02-18 DIAGNOSIS — F209 Schizophrenia, unspecified: Secondary | ICD-10-CM | POA: Diagnosis not present

## 2018-03-18 DIAGNOSIS — F259 Schizoaffective disorder, unspecified: Secondary | ICD-10-CM | POA: Diagnosis not present

## 2018-04-15 DIAGNOSIS — F209 Schizophrenia, unspecified: Secondary | ICD-10-CM | POA: Diagnosis not present

## 2018-05-13 DIAGNOSIS — F259 Schizoaffective disorder, unspecified: Secondary | ICD-10-CM | POA: Diagnosis not present

## 2018-05-27 DIAGNOSIS — F209 Schizophrenia, unspecified: Secondary | ICD-10-CM | POA: Diagnosis not present

## 2018-05-27 DIAGNOSIS — E7800 Pure hypercholesterolemia, unspecified: Secondary | ICD-10-CM | POA: Diagnosis not present

## 2018-05-27 DIAGNOSIS — I10 Essential (primary) hypertension: Secondary | ICD-10-CM | POA: Diagnosis not present

## 2018-05-27 DIAGNOSIS — Z6841 Body Mass Index (BMI) 40.0 and over, adult: Secondary | ICD-10-CM | POA: Diagnosis not present

## 2018-06-10 DIAGNOSIS — F259 Schizoaffective disorder, unspecified: Secondary | ICD-10-CM | POA: Diagnosis not present

## 2018-07-08 DIAGNOSIS — F209 Schizophrenia, unspecified: Secondary | ICD-10-CM | POA: Diagnosis not present

## 2018-08-05 DIAGNOSIS — F259 Schizoaffective disorder, unspecified: Secondary | ICD-10-CM | POA: Diagnosis not present

## 2018-09-02 DIAGNOSIS — F209 Schizophrenia, unspecified: Secondary | ICD-10-CM | POA: Diagnosis not present

## 2018-09-05 DIAGNOSIS — F209 Schizophrenia, unspecified: Secondary | ICD-10-CM | POA: Diagnosis not present

## 2018-09-05 DIAGNOSIS — E7800 Pure hypercholesterolemia, unspecified: Secondary | ICD-10-CM | POA: Diagnosis not present

## 2018-09-05 DIAGNOSIS — Z1389 Encounter for screening for other disorder: Secondary | ICD-10-CM | POA: Diagnosis not present

## 2018-09-05 DIAGNOSIS — Z0000 Encounter for general adult medical examination without abnormal findings: Secondary | ICD-10-CM | POA: Diagnosis not present

## 2018-09-05 DIAGNOSIS — Z6841 Body Mass Index (BMI) 40.0 and over, adult: Secondary | ICD-10-CM | POA: Diagnosis not present

## 2018-09-05 DIAGNOSIS — I10 Essential (primary) hypertension: Secondary | ICD-10-CM | POA: Diagnosis not present

## 2018-09-05 DIAGNOSIS — Z90710 Acquired absence of both cervix and uterus: Secondary | ICD-10-CM | POA: Diagnosis not present

## 2018-09-30 DIAGNOSIS — F209 Schizophrenia, unspecified: Secondary | ICD-10-CM | POA: Diagnosis not present

## 2018-10-15 DIAGNOSIS — K130 Diseases of lips: Secondary | ICD-10-CM | POA: Diagnosis not present

## 2018-10-28 DIAGNOSIS — F209 Schizophrenia, unspecified: Secondary | ICD-10-CM | POA: Diagnosis not present

## 2018-10-29 DIAGNOSIS — E782 Mixed hyperlipidemia: Secondary | ICD-10-CM | POA: Diagnosis not present

## 2018-10-29 DIAGNOSIS — I10 Essential (primary) hypertension: Secondary | ICD-10-CM | POA: Diagnosis not present

## 2018-10-29 DIAGNOSIS — F209 Schizophrenia, unspecified: Secondary | ICD-10-CM | POA: Diagnosis not present

## 2018-10-29 DIAGNOSIS — F952 Tourette's disorder: Secondary | ICD-10-CM | POA: Diagnosis not present

## 2018-11-25 DIAGNOSIS — F952 Tourette's disorder: Secondary | ICD-10-CM | POA: Diagnosis not present

## 2018-12-09 DIAGNOSIS — Z1231 Encounter for screening mammogram for malignant neoplasm of breast: Secondary | ICD-10-CM | POA: Diagnosis not present

## 2018-12-23 DIAGNOSIS — F209 Schizophrenia, unspecified: Secondary | ICD-10-CM | POA: Diagnosis not present

## 2019-01-09 DIAGNOSIS — Z1211 Encounter for screening for malignant neoplasm of colon: Secondary | ICD-10-CM | POA: Diagnosis not present

## 2019-01-20 DIAGNOSIS — F209 Schizophrenia, unspecified: Secondary | ICD-10-CM | POA: Diagnosis not present

## 2019-02-17 DIAGNOSIS — F209 Schizophrenia, unspecified: Secondary | ICD-10-CM | POA: Diagnosis not present

## 2019-02-23 DIAGNOSIS — Z20828 Contact with and (suspected) exposure to other viral communicable diseases: Secondary | ICD-10-CM | POA: Diagnosis not present

## 2019-03-17 DIAGNOSIS — K219 Gastro-esophageal reflux disease without esophagitis: Secondary | ICD-10-CM | POA: Diagnosis not present

## 2019-03-17 DIAGNOSIS — E7800 Pure hypercholesterolemia, unspecified: Secondary | ICD-10-CM | POA: Diagnosis not present

## 2019-03-17 DIAGNOSIS — F209 Schizophrenia, unspecified: Secondary | ICD-10-CM | POA: Diagnosis not present

## 2019-03-17 DIAGNOSIS — I10 Essential (primary) hypertension: Secondary | ICD-10-CM | POA: Diagnosis not present

## 2019-04-14 DIAGNOSIS — F209 Schizophrenia, unspecified: Secondary | ICD-10-CM | POA: Diagnosis not present

## 2019-05-12 DIAGNOSIS — F209 Schizophrenia, unspecified: Secondary | ICD-10-CM | POA: Diagnosis not present

## 2019-06-09 DIAGNOSIS — F209 Schizophrenia, unspecified: Secondary | ICD-10-CM | POA: Diagnosis not present

## 2019-06-25 DIAGNOSIS — R0683 Snoring: Secondary | ICD-10-CM | POA: Diagnosis not present

## 2019-06-25 DIAGNOSIS — G479 Sleep disorder, unspecified: Secondary | ICD-10-CM | POA: Diagnosis not present

## 2019-06-25 DIAGNOSIS — J301 Allergic rhinitis due to pollen: Secondary | ICD-10-CM | POA: Diagnosis not present

## 2019-07-07 ENCOUNTER — Other Ambulatory Visit: Payer: Self-pay

## 2019-07-07 ENCOUNTER — Ambulatory Visit (INDEPENDENT_AMBULATORY_CARE_PROVIDER_SITE_OTHER): Payer: BC Managed Care – PPO | Admitting: Allergy

## 2019-07-07 ENCOUNTER — Encounter: Payer: Self-pay | Admitting: Allergy

## 2019-07-07 VITALS — BP 118/84 | HR 91 | Resp 18 | Ht 65.5 in | Wt 260.0 lb

## 2019-07-07 DIAGNOSIS — H1013 Acute atopic conjunctivitis, bilateral: Secondary | ICD-10-CM

## 2019-07-07 DIAGNOSIS — F209 Schizophrenia, unspecified: Secondary | ICD-10-CM | POA: Diagnosis not present

## 2019-07-07 DIAGNOSIS — J3089 Other allergic rhinitis: Secondary | ICD-10-CM

## 2019-07-07 MED ORDER — OLOPATADINE HCL 0.2 % OP SOLN: OPHTHALMIC | 5 refills | Status: DC

## 2019-07-07 MED ORDER — AZELASTINE-FLUTICASONE 137-50 MCG/ACT NA SUSP: NASAL | 5 refills | Status: DC

## 2019-07-07 NOTE — Patient Instructions (Addendum)
Allergic rhinitis with conjunctivitis  - environmental allergy skin testing is positive to grass pollens, weed pollens, tree pollen, dust mite and mold  - allergen avoidance measures discussed/handouts provided  - trial dymista 1 spray each nostril twice a day.  This is a combination nasal spray with Flonase + Astelin (nasal antihistamine).  This helps with both nasal congestion and drainage.  Hold your Flonase while using Dymista.  If Dymista is not covered under your insurance plan then will prescribe Astelin separately.    - when using nasal sprays point the tip of the nostril out towards the ear on the same side nostril  - continue Allegra 180mg  daily  - recommend use of nasal saline rinses to help clean/flush out the sinuses and also helps your nasal sprays work more effectively  - for itchy/watery/red eyes Pataday 1 drop each eye daily as needed is a good option to continue as needed use  - allergen immunotherapy discussed today including protocol, benefits and risk.  Informational handout provided.  If interested in this therapuetic option you can check with your insurance carrier for coverage.  Let know if you would like to proceed with this option.    Follow-up in 4 months or sooner if needed

## 2019-07-07 NOTE — Progress Notes (Signed)
New Patient Note  RE: Gloria Dalton MRN: [id number] DOB: 09-Jul-1970 Date of Office Visit: 07/07/2019  Referring provider: Esperanza Richters, NP Primary care provider: Greig Right, MD  Chief Complaint: allergies  History of present illness: Gloria Dalton is a 48 y.o. female presenting today for consultation for allergic rhinitis.    She reports having with nasal congestion with lots of blowing nose, post-nasal drip, scratchy throat and cough, sneezing.  She also reports having itchy and watery eyes. She states has had them symptoms as long as she can remember on and off.  Symptoms can be year-round.   She has been using Flonase 2 sprays once a day since May 3rd and states symptoms have improved with less congestion and less scratchiness in the throat.   She has used pataday which helped but doesn't like putting drop sin her eyes . Also using Allegra with the Flonase.     Denies history of asthma or eczema.  She states as a toddler her mother told her she would have swelling episodes and was told she was allergic to something but never found out.  She states her mother told her she outgrew this.   She denies a food allergy and does not avoid any foods.   Review of systems: Review of Systems  Constitutional: Negative.   HENT:       See HPI  Eyes: Negative.   Respiratory: Negative.   Cardiovascular: Negative.   Gastrointestinal: Negative.   Genitourinary: Negative.   Musculoskeletal: Negative.   Skin: Negative.   Neurological: Negative.     All other systems negative unless noted above in HPI  Past medical history: Past Medical History:  Diagnosis Date  . High blood pressure   . High cholesterol     Past surgical history: Past Surgical History:  Procedure Laterality Date  . BREAST LUMPECTOMY    . PARTIAL HYSTERECTOMY     age 37  . TONSILLECTOMY    . UTERINE FIBROID SURGERY      Family history:  Family History  Problem Relation Age of Onset  . Breast  cancer Mother   . Heart attack Father   . High blood pressure Father   . Breast cancer Maternal Aunt   . Stroke Maternal Grandmother   . Heart attack Maternal Grandfather   . High blood pressure Paternal Grandfather   . Breast cancer Maternal Aunt     Social history: She lives in a home without carpeting with heat pump.  No pets in the home.  No concern for water damage, mildew or roaches in the home.  She is a Firefighter.  Denies a smoking history.    Medication List: Current Outpatient Medications  Medication Sig Dispense Refill  . amLODipine (NORVASC) 10 MG tablet Take 10 mg by mouth daily.    . fexofenadine (ALLEGRA) 180 MG tablet Take 180 mg by mouth daily.    . fluticasone (FLONASE) 50 MCG/ACT nasal spray Place 1 spray into both nostrils 2 (two) times daily.    . haloperidol decanoate (HALDOL DECANOATE) 100 MG/ML injection Inject into the muscle every 28 (twenty-eight) days.    . Multiple Vitamins-Minerals (MULTIVITAMIN WOMEN PO) Take by mouth.    . Probiotic Product (PROBIOTIC DAILY PO) Take by mouth. Nature's Bounty brand    . rosuvastatin (CRESTOR) 20 MG tablet Take 20 mg by mouth at bedtime.     No current facility-administered medications for this visit.    Known medication  allergies: Allergies  Allergen Reactions  . Benicar [Olmesartan] Other (See Comments)    Abdominal pain/severe  . Penicillins Other (See Comments)    Patient unsure; gas/bloating     Physical examination: Blood pressure 118/84, pulse 91, resp. rate 18, height 5' 5.5" (1.664 m), weight 260 lb (117.9 kg), SpO2 97 %.  General: Alert, interactive, in no acute distress. HEENT: PERRLA, TMs pearly gray, turbinates moderately edematous without discharge, post-pharynx non erythematous. Neck: Supple without lymphadenopathy. Lungs: Clear to auscultation without wheezing, rhonchi or rales. {no increased work of breathing. CV: Normal S1, S2 without murmurs. Abdomen: Nondistended,  nontender. Skin: Warm and dry, without lesions or rashes. Extremities:  No clubbing, cyanosis or edema. Neuro:   Grossly intact.  Diagnositics/Labs: Allergy testing: environmental allergy skin prick testing is positive to [place] blue, meadow fescue, giant ragweed, english plantain, black walnut pollen, penicillium, epicoccum nigrum, dust mite (d. Pteronyssius) Allergy testing results were read and interpreted by provider, documented by clinical staff.   Assessment and plan:   Allergic rhinitis with conjunctivitis  - environmental allergy skin testing is positive to grass pollens, weed pollens, tree pollen, dust mite and mold  - allergen avoidance measures discussed/handouts provided  - trial dymista 1 spray each nostril twice a day.  This is a combination nasal spray with Flonase + Astelin (nasal antihistamine).  This helps with both nasal congestion and drainage.  Hold your Flonase while using Dymista.  If Dymista is not covered under your insurance plan then will prescribe Astelin separately.    - when using nasal sprays point the tip of the nostril out towards the ear on the same side nostril  - continue Allegra 180mg  daily  - recommend use of nasal saline rinses to help clean/flush out the sinuses and also helps your nasal sprays work more effectively  - for itchy/watery/red eyes Pataday 1 drop each eye daily as needed is a good option to continue as needed use  - allergen immunotherapy discussed today including protocol, benefits and risk.  Informational handout provided.  If interested in this therapuetic option you can check with your insurance carrier for coverage.  Let know if you would like to proceed with this option.    Follow-up in 4 months or sooner if needed  I appreciate the opportunity to take part in Gloria Dalton's care. Please do not hesitate to contact me with questions.  Sincerely,   [place], MD Allergy/Immunology [facility]

## 2019-07-08 ENCOUNTER — Telehealth: Payer: Self-pay | Admitting: Allergy

## 2019-07-08 NOTE — Telephone Encounter (Signed)
Since Allegra makes her drowsy she can try either Zyrtec 10mg  or Xyzl 5mg  to see if these antihistamines will not make her drowsy

## 2019-07-08 NOTE — Telephone Encounter (Signed)
Unable to reach patient. Left voicemail message for patient to return call  °

## 2019-07-08 NOTE — Telephone Encounter (Signed)
FYI patient stated she will discontinue taking the Allegra prescribed as it makes her extremely drowsy

## 2019-07-09 NOTE — Telephone Encounter (Signed)
Called and spoke with patient and she states that Zyrtec makes her drowsy and she is concerned of addictive side effects from Xyzal. She states she will try taking the Allegra at night and will call back if it is not managing her symptoms.

## 2019-07-09 NOTE — Telephone Encounter (Signed)
Called both available numbers and left a voicemail asking for patient to return call to discuss.  

## 2019-07-21 ENCOUNTER — Telehealth: Payer: Self-pay | Admitting: Allergy

## 2019-07-21 NOTE — Telephone Encounter (Signed)
Ok that is fine to start over with just Flonase 2 sprays each nostril daily for 1-2 weeks at a time and can stop if symptoms improve.  If she still has a degree of nasal drainage then will need to consider nasal Atrovent to help as Flonase may not provide enough covered for nasal drainage if that is a big component.

## 2019-07-21 NOTE — Telephone Encounter (Signed)
Patient informed.  She is just going to try the Flonase and then will let us know if she decides to try the Atrovent.

## 2019-07-21 NOTE — Telephone Encounter (Signed)
Dr. Padgett, please advise.  

## 2019-07-21 NOTE — Telephone Encounter (Signed)
Patient called and states that when she started taking the combination of Flonase and Azelastine it made her cough and scratchy throat come back. Patient states that she thinks she is allergic to the Azelastine or her body does not get a long with it. Patient thinks she should start over with just Flonase as that seemed to work better.  Please advise.

## 2019-07-23 ENCOUNTER — Telehealth: Payer: Self-pay

## 2019-07-23 MED ORDER — IPRATROPIUM BROMIDE 0.06 % NA SOLN: NASAL | 5 refills | Status: DC

## 2019-07-23 NOTE — Telephone Encounter (Signed)
Patient called and does want to try the Atrovent.  ERX sent to [facility] in [place].

## 2019-07-23 NOTE — Telephone Encounter (Signed)
Ok thanks 

## 2019-07-28 ENCOUNTER — Telehealth: Payer: Self-pay | Admitting: Allergy

## 2019-07-28 NOTE — Telephone Encounter (Signed)
Unfortunately both flonase (and other steroid sprays) and the Ipratropium are the ones that can help control congestion.    If she has not tried Astelin we can try that nasal spray which helps with nasal drainage but not great at managing congestion.   Allergy shots (immunotherapy) would be her best management to be able to not be dependent on nose sprays or other medications for control.  If congestion is a big issue then we could try steroid rinses if she can tolerate nasal rinses as below: Budesonide (Pulmicort) + Saline Irrigation/Rinse  Budesonide (Pulmicort) is an anti-inflammatory steroid medication used to decrease nasal and sinus inflammation. It is dispensed in liquid form in a vial. Although it is manufactured for use with a nebulizer, we intend for you to use it with the NeilMed Sinus Rinse bottle (preferred) or a Neti pot.   Instructions:  1) Make 240cc of saline in the NeilMed bottle using the salt packets or your own saline recipe (see separate handout).  2) Add the entire 2cc vial of liquid Budesonide (Pulmicort) to the rinse bottle and mix together.  3) While in the shower or over the sink, tilt your head forward to a comfortable level. Put the tip of the sinus rinse bottle in your nostril and aim it towards the crown or top of your head. Gently squeeze the bottle to flush out your nose. The fluid will circulate in and out of your sinus cavities, coming back out from either nostril or through your mouth. Try not to swallow large quantities and spit it out instead.  4) Perform Budesonide (Pulmicort) + Saline irrigations 2 times daily.

## 2019-07-28 NOTE — Telephone Encounter (Signed)
Gloria Dalton called in and states she is having no relief from any of the nose sprays.  She states she has been using both nose sprays and nothing is helping her.  She is taking Flonase and Ipratropium.  Maygen would like to know what she can do next.  Please advise.

## 2019-07-28 NOTE — Telephone Encounter (Signed)
Returned call to patient, and patient informed me that after she called [place], she attempted using nasal saline and her ipratropium nasal spray and has been feeling much better. However she did state that her prescription for the ipratropium stated that she could only use it 2 sprays once daily and that she would like to know if she could use it more, so I got a verbal okay from Dr. Delorse Lek to instruct the patient that it was fine to use 3-4 times daily. Patient informed to return call to office if she needed [place] and she voiced understanding. No further questions at this time.

## 2019-07-29 ENCOUNTER — Telehealth: Payer: Self-pay | Admitting: Allergy

## 2019-07-29 NOTE — Telephone Encounter (Signed)
Carmell called in and states that several people she knows told her she should get a Kenalog injection.  Alegandra stated nothing was helping her.  I did encourage her to continue the plan Dr. Delorse Lek advised her of yesterday.  I informed her she should try this method out for several days and see if this helps.  Fumie states she will try it but would also like me to go ahead and as Dr. Delorse Lek if the Kenalog shot is an option because she would really like to receive it.  I did inform her we do not give that injection here in her office and she stated that was fine she just wanted to know if Dr. Delorse Lek recommended Kenalog. Please advise.

## 2019-07-29 NOTE — Telephone Encounter (Signed)
A steroid injection in theory is fine to help with allergy symptom control however it is not a long term solution.  It also comes with side effects that we like to avoid like increase in appetite, weight gain, increased anxiousness, jitteriness and with more chronic use can see increase in infections, bone loss.   Thus yes she could get a steroid injection for temporary relief but would not depend on steroid injection for long-term control.     Allergy shots would be a better long-term solution to be able to decrease need for allergy medications to begin with

## 2019-07-30 NOTE — Telephone Encounter (Signed)
Ok we could work around her schedule if she is interesting in immunotherapy.  It would just be a slower build-up but if she could come every other week we can make that work for her for more long-term control of her symptoms.

## 2019-07-30 NOTE — Telephone Encounter (Signed)
I spoke with Marcelle Smiling.  She was a little disappointed about the Kenalog injection but was ok once I explained the side effects she understood.  I did go over the immunotherapy with her.  Adan states she has to go out of town every other week and will not be able to commit to the injections weekly. Jalicia would like to know if there is anything else that can be done.  Please advise.

## 2019-07-31 NOTE — Telephone Encounter (Signed)
Left message to call the office.

## 2019-08-03 NOTE — Telephone Encounter (Signed)
Patient is still having issues with a cough that seems to be a little worse. Her allergy symptoms are also bothering her despite using the medications. Her primary care provider prescribed Rhinocort nasal spray which she just started this morning. She was under the impression that she could come in tomorrow for the allergy injections. I have explained in detail to her the process and length of time of the immunotherapy process. Patient misunderstood when she was told that she could just come in weekly. Patient is aware of the process now. She wanted to know about using Rhinocort and what your opinion is on this medication. She is supposed to be calling back Friday with an update unless you have any further recommendations. Patient also had a question about an explanation of benefits she received from her insurance company and whether or not that was a bill. I let her know this was just letting her know what her insurance paid and what she may be billed. I told her to wait until she received a bill from our office for further information on how much she does/does not owe [place] as I am not sure on that matter. I did suggest that she schedule an office visit if she feels the medications are not working and her symptoms are worsening. Patient did not schedule this nor a new start appointment. I spent a total of 18.5 minutes on phone with patient.

## 2019-08-03 NOTE — Telephone Encounter (Signed)
Patient called back again and has been scheduled with Dr. Delorse Lek on 08-04-19 in the [place] office at 1000. Patient verified appointment details and arrival time of 945am.

## 2019-08-04 ENCOUNTER — Ambulatory Visit (INDEPENDENT_AMBULATORY_CARE_PROVIDER_SITE_OTHER): Payer: BC Managed Care – PPO | Admitting: Allergy

## 2019-08-04 ENCOUNTER — Other Ambulatory Visit: Payer: Self-pay

## 2019-08-04 ENCOUNTER — Encounter: Payer: Self-pay | Admitting: Allergy

## 2019-08-04 VITALS — BP 140/82 | HR 100 | Resp 16

## 2019-08-04 DIAGNOSIS — K219 Gastro-esophageal reflux disease without esophagitis: Secondary | ICD-10-CM | POA: Diagnosis not present

## 2019-08-04 DIAGNOSIS — F209 Schizophrenia, unspecified: Secondary | ICD-10-CM | POA: Diagnosis not present

## 2019-08-04 DIAGNOSIS — J3089 Other allergic rhinitis: Secondary | ICD-10-CM | POA: Diagnosis not present

## 2019-08-04 DIAGNOSIS — H1013 Acute atopic conjunctivitis, bilateral: Secondary | ICD-10-CM | POA: Diagnosis not present

## 2019-08-04 MED ORDER — OLOPATADINE HCL 0.6 % NA SOLN: NASAL | 5 refills | Status: DC

## 2019-08-04 MED ORDER — OMEPRAZOLE 40 MG PO CPDR: 40.0000 mg | DELAYED_RELEASE_CAPSULE | Freq: Every day | ORAL | 5 refills | Status: DC

## 2019-08-04 NOTE — Progress Notes (Signed)
Follow-up Note  RE: Gloria Dalton MRN: [id number] DOB: 1971-01-05 Date of Office Visit: 08/04/2019   History of present illness: Gloria Dalton is a 49 y.o. female presenting today for follow-up of allergic rhinitis with conjunctivitis.  She was last seen in the office on 07/07/2019 by myself.  Since this time she has struggled with control of her nasal congestion and drainage.  This leads to scratchy throat.  She states the drainage is so much that she is having a lot of coughing.  She also reports watery eyes.  She states she has been doing a lot of research online and states that similar people have had her issue  and they have responded well antireflux medication.  We recommended at the last visit that she use Flonase and Astelin but she states these 2 did not help her symptoms at all thus we had her try nasal ipratropium which he also states was not helpful.  She is now on Rhinocort recommended by I believe her PCP.  She states she has been only using it about 2 days but is not seeing too much of a difference.  She also feels that the steroid sprays have increased her congestion.  She is taking Allegra daily.  She is interested in allergen immunotherapy however she states that she works 1 week on 1 week off basically and thus may not be able to come on a weekly basis for injections.  Review of systems: Review of Systems  Constitutional: Negative.   HENT:       See HPI  Eyes:       See HPI  Respiratory: Positive for cough. Negative for sputum production, shortness of breath and wheezing.   Cardiovascular: Negative.   Gastrointestinal: Negative.   Musculoskeletal: Negative.   Skin: Negative.   Neurological: Negative.     All other systems negative unless noted above in HPI  Past medical/social/surgical/family history have been reviewed and are unchanged unless specifically indicated below.  No changes  Medication List: Current Outpatient Medications  Medication Sig Dispense  Refill  . amLODipine (NORVASC) 10 MG tablet Take 10 mg by mouth daily.    . budesonide (RHINOCORT ALLERGY) 32 MCG/ACT nasal spray Place 2 sprays into both nostrils daily.    . fexofenadine (ALLEGRA) 180 MG tablet Take 180 mg by mouth daily.    . haloperidol decanoate (HALDOL DECANOATE) 100 MG/ML injection Inject into the muscle every 28 (twenty-eight) days.    . Multiple Vitamins-Minerals (MULTIVITAMIN WOMEN PO) Take by mouth.    . Olopatadine HCl 0.2 % SOLN Can use one drop in each eye once daily if needed for itchy, watery, red eyes. 2.5 mL 5  . Probiotic Product (PROBIOTIC DAILY PO) Take by mouth. [employer] brand    . rosuvastatin (CRESTOR) 20 MG tablet Take 20 mg by mouth at bedtime.    . Olopatadine HCl 0.6 % SOLN Use two sprays in each nostril twice daily as directed. 30.5 g 5  . omeprazole (PRILOSEC) 40 MG capsule Take 1 capsule (40 mg total) by mouth daily. 30 capsule 5   No current facility-administered medications for this visit.     Known medication allergies: Allergies  Allergen Reactions  . Benicar [Olmesartan] Other (See Comments)    Abdominal pain/severe  . Penicillins Other (See Comments)    Patient unsure; gas/bloating     Physical examination: Blood pressure 140/82, pulse 100, resp. rate 16, SpO2 98 %.  General: Alert, interactive, in no acute distress.  HEENT: PERRLA, TMs pearly gray, turbinates moderately edematous with clear discharge, post-pharynx non erythematous. Neck: Supple without lymphadenopathy. Lungs: Clear to auscultation without wheezing, rhonchi or rales. {no increased work of breathing. CV: Normal S1, S2 without murmurs. Abdomen: Nondistended, nontender. Skin: Warm and dry, without lesions or rashes. Extremities:  No clubbing, cyanosis or edema. Neuro:   Grossly intact.  Diagnositics/Labs: None today   Assessment and plan:    Allergic rhinitis with conjunctivitis  - continue avoidance measures for grass pollens, weed pollens, tree  pollen, dust mite and mold  - at this time have tried Flonase, Astelin/Azelastine, Atrovent nasal sprays without much benefit.  Thus will try Patanase nasal spray as last standard nasal spray option.  Patanase is a nasal antihistamine that helps decrease nasal drainage.    - when using nasal sprays point the tip of the nostril out towards the ear on the same side nostril  - continue Allegra 180mg  daily  - recommend use of nasal saline rinses to help clean/flush out the sinuses and also helps your nasal sprays work more effectively  - for itchy/watery/red eyes Pataday 1 drop each eye daily as needed is a good option to continue as needed use  - allergen immunotherapy discussed today including protocol, benefits and risk.  Informational handout provided.  If interested in this therapuetic option you can check with your insurance carrier for coverage. Once you have checked with your insurance call and schedule a new start appointment.  Will need to provide you with an epinephrine device to bring on days of injections  Reflux  - will have you trial Omeprazole 40mg  daily.  This is a proton pump inhibitor to be used for 6-8 weeks to see if this improves your cough symptoms  Return on Thursday 08/06/2019 to receive a depomedrol injection to help with quicker control of your allergy symptoms  Follow-up in 4 months or sooner if needed  I appreciate the opportunity to take part in Collen's care. Please do not hesitate to contact me with questions.  Sincerely,   , MD Allergy/Immunology [facility]

## 2019-08-04 NOTE — Patient Instructions (Addendum)
Allergic rhinitis with conjunctivitis  - continue avoidance measures for grass pollens, weed pollens, tree pollen, dust mite and mold  - at this time have tried Flonase, Astelin/Azelastine, Atrovent nasal sprays without much benefit.  Thus will try Patanase nasal spray as last standard nasal spray option.  Patanase is a nasal antihistamine that helps decrease nasal drainage.    - when using nasal sprays point the tip of the nostril out towards the ear on the same side nostril  - continue Allegra 180mg  daily  - recommend use of nasal saline rinses to help clean/flush out the sinuses and also helps your nasal sprays work more effectively  - for itchy/watery/red eyes Pataday 1 drop each eye daily as needed is a good option to continue as needed use  - allergen immunotherapy discussed today including protocol, benefits and risk.  Informational handout provided.  If interested in this therapuetic option you can check with your insurance carrier for coverage. Once you have checked with your insurance call and schedule a new start appointment.  Will need to provide you with an epinephrine device to bring on days of injections  Reflux  - will have you trial Omeprazole 40mg  daily.  This is a proton pump inhibitor to be used for 6-8 weeks to see if this improves your cough symptoms  Return on Thursday 08/06/2019 to receive a depomedrol injection to help with quicker control of your allergy symptoms  Follow-up in 4 months or sooner if needed

## 2019-08-06 ENCOUNTER — Ambulatory Visit (INDEPENDENT_AMBULATORY_CARE_PROVIDER_SITE_OTHER): Payer: BC Managed Care – PPO | Admitting: *Deleted

## 2019-08-06 ENCOUNTER — Other Ambulatory Visit: Payer: Self-pay

## 2019-08-06 DIAGNOSIS — J3089 Other allergic rhinitis: Secondary | ICD-10-CM | POA: Diagnosis not present

## 2019-08-06 DIAGNOSIS — H1013 Acute atopic conjunctivitis, bilateral: Secondary | ICD-10-CM | POA: Diagnosis not present

## 2019-08-06 MED ORDER — METHYLPREDNISOLONE ACETATE 80 MG/ML IJ SUSP
80.0000 mg | Freq: Once | INTRAMUSCULAR | Status: AC
  Administered 2019-08-06: 80 mg via INTRAMUSCULAR

## 2019-08-06 NOTE — Progress Notes (Signed)
DepoMedrol injection given per Dr. Randell Patient order from her Tuesday office visit.   DepoMedrol 80mg   NDC: LOT: 1031-2811-88 B EXP: 01-20-20  Given in Left Deltoid per patients request.  Tolerated well.

## 2019-08-11 ENCOUNTER — Telehealth: Payer: Self-pay | Admitting: Allergy

## 2019-08-11 NOTE — Telephone Encounter (Signed)
Great to hear!

## 2019-08-11 NOTE — Telephone Encounter (Signed)
Gloria Dalton called and wanted to update Dr. Delorse Lek on her progress.  Gloria Dalton states her coughing is better and is calming down.  She states her coughing spells are slowing down and not having them as often as well as not as severe as they have been.  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  She wants to thank Dr. Delorse Lek.

## 2019-08-19 DIAGNOSIS — R5383 Other fatigue: Secondary | ICD-10-CM | POA: Diagnosis not present

## 2019-08-19 DIAGNOSIS — J301 Allergic rhinitis due to pollen: Secondary | ICD-10-CM | POA: Diagnosis not present

## 2019-08-19 DIAGNOSIS — R05 Cough: Secondary | ICD-10-CM | POA: Diagnosis not present

## 2019-08-19 DIAGNOSIS — G4733 Obstructive sleep apnea (adult) (pediatric): Secondary | ICD-10-CM | POA: Diagnosis not present

## 2019-09-01 DIAGNOSIS — F209 Schizophrenia, unspecified: Secondary | ICD-10-CM | POA: Diagnosis not present

## 2019-09-07 ENCOUNTER — Telehealth: Payer: Self-pay

## 2019-09-07 NOTE — Telephone Encounter (Signed)
Gloria Dalton called saying that since she stopped her Omeprazole last Saturday she has noticed some throat scratchiness and throat clearing.  I advised her to retry the Omeprazole once daily to see if that helps and to keep [place] updated. She is going to restart the Omeprazole today.

## 2019-09-08 DIAGNOSIS — J301 Allergic rhinitis due to pollen: Secondary | ICD-10-CM | POA: Diagnosis not present

## 2019-09-08 NOTE — Telephone Encounter (Signed)
Ok thanks.   Sounds like she does have degree of reflux.  Agree with staying on omeprazole at this time.

## 2019-09-08 NOTE — Progress Notes (Addendum)
VIALS EXP 09-07-20.  ADDITIONAL LABELS NEEDED

## 2019-09-08 NOTE — Addendum Note (Signed)
Addended by: Lorrin Mais on: 09/08/2019 11:08 AM   Modules accepted: Orders

## 2019-09-09 DIAGNOSIS — J3089 Other allergic rhinitis: Secondary | ICD-10-CM | POA: Diagnosis not present

## 2019-09-11 NOTE — Telephone Encounter (Signed)
Thanks [employer].  Those recommendations all sound great

## 2019-09-11 NOTE — Telephone Encounter (Signed)
FYI : Patient called to give [place] an update.  Since restarting the Omeprazole she is having less throat clearing and throat scratchiness.  It has not totally gone away but it is better.  She did mention that the generic Patanase seems to burn her nasal passages.  I suggested that she back off on the nose spray to just one spray once daily and see if that helps; she had been using it two sprays once daily.  She will keep [place] updated.

## 2019-09-17 DIAGNOSIS — Z6841 Body Mass Index (BMI) 40.0 and over, adult: Secondary | ICD-10-CM | POA: Diagnosis not present

## 2019-09-17 DIAGNOSIS — I10 Essential (primary) hypertension: Secondary | ICD-10-CM | POA: Diagnosis not present

## 2019-09-17 DIAGNOSIS — E7800 Pure hypercholesterolemia, unspecified: Secondary | ICD-10-CM | POA: Diagnosis not present

## 2019-09-17 DIAGNOSIS — F209 Schizophrenia, unspecified: Secondary | ICD-10-CM | POA: Diagnosis not present

## 2019-09-29 ENCOUNTER — Telehealth: Payer: Self-pay | Admitting: *Deleted

## 2019-09-29 ENCOUNTER — Ambulatory Visit: Payer: BC Managed Care – PPO

## 2019-09-29 DIAGNOSIS — F209 Schizophrenia, unspecified: Secondary | ICD-10-CM | POA: Diagnosis not present

## 2019-09-29 NOTE — Telephone Encounter (Signed)
Ok thanks 

## 2019-09-29 NOTE — Telephone Encounter (Signed)
FYI:  Gloria Dalton called to inform us that she is going to stop taking her Omeprazole but continue her other medications. She just wanted this documented in her record. She will let us know if she has any issues.

## 2019-09-30 DIAGNOSIS — G4733 Obstructive sleep apnea (adult) (pediatric): Secondary | ICD-10-CM | POA: Diagnosis not present

## 2019-10-09 ENCOUNTER — Telehealth: Payer: Self-pay | Admitting: Allergy

## 2019-10-09 NOTE — Telephone Encounter (Signed)
Ok thanks for update

## 2019-10-09 NOTE — Telephone Encounter (Signed)
Gloria Dalton called in and stated she stopped taking omeprazole about a week ago and noticed that the scratchiness in her throat has ceased.  Gloria Dalton is still taking all other medications.Gloria Dalton states she feels the issues she was having was all allergy related and is going to eliminate omeprazole permanently.  Gloria Dalton  States since stopping omeprazole she feels much better.

## 2019-10-13 ENCOUNTER — Ambulatory Visit (INDEPENDENT_AMBULATORY_CARE_PROVIDER_SITE_OTHER): Payer: BC Managed Care – PPO | Admitting: *Deleted

## 2019-10-13 ENCOUNTER — Other Ambulatory Visit: Payer: Self-pay

## 2019-10-13 DIAGNOSIS — J309 Allergic rhinitis, unspecified: Secondary | ICD-10-CM | POA: Diagnosis not present

## 2019-10-13 MED ORDER — EPINEPHRINE 0.3 MG/0.3ML IJ SOAJ: INTRAMUSCULAR | 3 refills | Status: DC

## 2019-10-13 NOTE — Progress Notes (Signed)
Immunotherapy   Patient Details  Name: Gloria Dalton MRN: [id number] Date of Birth: 10/04/1970  10/13/2019  [name] - POLLEN - MOLD/MITE - 09/07/2020 Following schedule: B  Frequency: WEEKLY Epi-Pen: YES Consent signed and patient instructions given.   Redge Gainer 10/13/2019, 10:33 AM

## 2019-10-14 DIAGNOSIS — G4733 Obstructive sleep apnea (adult) (pediatric): Secondary | ICD-10-CM | POA: Diagnosis not present

## 2019-10-14 DIAGNOSIS — J301 Allergic rhinitis due to pollen: Secondary | ICD-10-CM | POA: Diagnosis not present

## 2019-10-14 DIAGNOSIS — R5383 Other fatigue: Secondary | ICD-10-CM | POA: Diagnosis not present

## 2019-10-27 DIAGNOSIS — F209 Schizophrenia, unspecified: Secondary | ICD-10-CM | POA: Diagnosis not present

## 2019-10-30 ENCOUNTER — Ambulatory Visit (INDEPENDENT_AMBULATORY_CARE_PROVIDER_SITE_OTHER): Payer: BC Managed Care – PPO | Admitting: *Deleted

## 2019-10-30 DIAGNOSIS — J309 Allergic rhinitis, unspecified: Secondary | ICD-10-CM

## 2019-11-09 ENCOUNTER — Telehealth: Payer: Self-pay | Admitting: Allergy

## 2019-11-09 NOTE — Telephone Encounter (Signed)
Gloria Dalton called in and stated she gets a scratchy throat at times.  Gloria Dalton states it comes and goes and she knows it is her allergies.  She was wondering if Dr. Delorse Lek had any suggestions on how to take care of her throat without her having to come in and be seen.  Valrie denies fever, cough, congestion, and runny nose.  Please advise.

## 2019-11-10 ENCOUNTER — Ambulatory Visit: Payer: BC Managed Care – PPO | Admitting: Allergy

## 2019-11-10 NOTE — Telephone Encounter (Signed)
Called and informed patient of Dr. Randell Patient message. She does not like to use the Patanase because it makes her drowsy. She does have a follow-up in October and can keep [place] updated until then.

## 2019-11-10 NOTE — Telephone Encounter (Signed)
Gloria Dalton called back and wanted me to add that her eyes are itching and watery but her left eye in running.  Gloria Dalton states when she uses pataday it does get better but when she stops it starts to run again.  Gloria Dalton stated her eyes have always done this ever since she was a little girl.

## 2019-11-10 NOTE — Telephone Encounter (Signed)
Unfortunately we do not have great options for maintenance of eye symptoms.  The eye drops are all for as needed use when symptomatic.  If needed could do a second drop to the eye when eye symptoms are worse.  Otherwise we could try changing to another eye drop that is meant to be use twice a day as needed.    For her scratchy throat she can try taking an extra Allegra if able on those days to see if it helps.  Also should be using the patanase 2 sprays twice a day to see if this helps minimize nasal drainage would could lead to scratch throat.

## 2019-11-11 DIAGNOSIS — R011 Cardiac murmur, unspecified: Secondary | ICD-10-CM | POA: Diagnosis not present

## 2019-11-11 DIAGNOSIS — Z6841 Body Mass Index (BMI) 40.0 and over, adult: Secondary | ICD-10-CM | POA: Diagnosis not present

## 2019-11-11 DIAGNOSIS — I10 Essential (primary) hypertension: Secondary | ICD-10-CM | POA: Diagnosis not present

## 2019-11-11 DIAGNOSIS — R600 Localized edema: Secondary | ICD-10-CM | POA: Diagnosis not present

## 2019-11-11 DIAGNOSIS — Z1389 Encounter for screening for other disorder: Secondary | ICD-10-CM | POA: Diagnosis not present

## 2019-11-11 DIAGNOSIS — R739 Hyperglycemia, unspecified: Secondary | ICD-10-CM | POA: Diagnosis not present

## 2019-11-11 DIAGNOSIS — Z0000 Encounter for general adult medical examination without abnormal findings: Secondary | ICD-10-CM | POA: Diagnosis not present

## 2019-11-11 DIAGNOSIS — Z90710 Acquired absence of both cervix and uterus: Secondary | ICD-10-CM | POA: Diagnosis not present

## 2019-11-13 ENCOUNTER — Ambulatory Visit (INDEPENDENT_AMBULATORY_CARE_PROVIDER_SITE_OTHER): Payer: BC Managed Care – PPO | Admitting: *Deleted

## 2019-11-13 DIAGNOSIS — J309 Allergic rhinitis, unspecified: Secondary | ICD-10-CM

## 2019-11-24 ENCOUNTER — Ambulatory Visit (INDEPENDENT_AMBULATORY_CARE_PROVIDER_SITE_OTHER): Payer: BC Managed Care – PPO | Admitting: Allergy

## 2019-11-24 ENCOUNTER — Other Ambulatory Visit: Payer: Self-pay

## 2019-11-24 ENCOUNTER — Encounter: Payer: Self-pay | Admitting: Allergy

## 2019-11-24 VITALS — BP 120/82 | HR 70 | Resp 16

## 2019-11-24 DIAGNOSIS — H1013 Acute atopic conjunctivitis, bilateral: Secondary | ICD-10-CM

## 2019-11-24 DIAGNOSIS — F209 Schizophrenia, unspecified: Secondary | ICD-10-CM | POA: Diagnosis not present

## 2019-11-24 DIAGNOSIS — K219 Gastro-esophageal reflux disease without esophagitis: Secondary | ICD-10-CM

## 2019-11-24 DIAGNOSIS — J3089 Other allergic rhinitis: Secondary | ICD-10-CM

## 2019-11-24 MED ORDER — FAMOTIDINE 20 MG PO TABS: ORAL_TABLET | ORAL | 5 refills | Status: DC

## 2019-11-24 NOTE — Patient Instructions (Addendum)
Allergic rhinitis with conjunctivitis  - continue avoidance measures for grass pollens, weed pollens, tree pollen, dust mite and mold  - continue allergen immunotherapy (allergy shots) per schedule.  Building up well.  Have access to epinephrine device on your injection days.    - continue Allegra 180mg  daily  - for itchy/watery/red eyes Pataday 1 drop each eye daily as needed is a good option to continue as needed use  Reflux  - symptoms improved on omeprazole.    - at this time recommend stopping omperazole and change over to Pepcid 20mg  1-2 times a day for more long term control of reflux  - continue your lifestyle modifications including not eating before bedtime and avoiding foods that trigger reflux  Follow-up in 4-6 months or sooner if needed

## 2019-11-24 NOTE — Progress Notes (Signed)
Follow-up Note  RE: Gloria Dalton MRN: [id number] DOB: 1970/04/22 Date of Office Visit: 11/24/2019   History of present illness: Gloria Dalton is a 49 y.o. female presenting today for follow-up of allergic rhinitis with conjunctivitis and reflux.  She was last seen in the office on 08/04/2018 by myself.  She states she has been doing well and is blessed since her last visit. She started on immunotherapy since her last visit and is doing well with her injections.  She is coming every other week due to her work requirements.  She has not having any large local or systemic reactions at this time. She states the Pataday has been helpful.  She uses it once a day and states her eyes are less red and she has less watering or goopiness of corner of her eyes. Has not been able to tolerate nasal spray use.  Does continue to take Allegra daily. With omeprazole use she states she has not had the reflux late at night.  She is wondering how safe this medication is for long-term use. She states that the sleep study she had scheduled but had to be canceled as they did not have a sleep tech to perform this.  She also states her primary care heard a heart murmur and she is being set up with a echo to evaluate this. She states since starting amlodipine she has had less swelling in her abdomen as well as her lower extremities.    Review of systems: Review of Systems  Constitutional: Negative.   HENT: Negative.   Eyes: Negative.   Respiratory: Negative.   Cardiovascular: Negative.   Gastrointestinal: Negative.   Musculoskeletal: Negative.   Skin: Negative.   Neurological: Negative.     All other systems negative unless noted above in HPI  Past medical/social/surgical/family history have been reviewed and are unchanged unless specifically indicated below.  No changes  Medication List: Current Outpatient Medications  Medication Sig Dispense Refill  . EPINEPHrine 0.3 mg/0.3 mL IJ SOAJ injection  Use as directed for life threatening allergic reaction 2 each 3  . fexofenadine (ALLEGRA) 180 MG tablet Take 180 mg by mouth daily.    . haloperidol decanoate (HALDOL DECANOATE) 100 MG/ML injection Inject into the muscle every 28 (twenty-eight) days.    . hydrochlorothiazide (HYDRODIURIL) 12.5 MG tablet Take 12.5 mg by mouth daily.    . metoprolol succinate (TOPROL-XL) 50 MG 24 hr tablet Take 50 mg by mouth daily.    . Multiple Vitamins-Minerals (MULTIVITAMIN WOMEN PO) Take by mouth.    . Olopatadine HCl 0.2 % SOLN Can use one drop in each eye once daily if needed for itchy, watery, red eyes. 2.5 mL 5  . omeprazole (PRILOSEC) 40 MG capsule Take 1 capsule (40 mg total) by mouth daily. 30 capsule 5  . rosuvastatin (CRESTOR) 20 MG tablet Take 20 mg by mouth at bedtime.     No current facility-administered medications for this visit.     Known medication allergies: Allergies  Allergen Reactions  . Benicar [Olmesartan] Other (See Comments)    Abdominal pain/severe  . Penicillins Other (See Comments)    Patient unsure; gas/bloating     Physical examination: Blood pressure 120/82, pulse 70, resp. rate 16, SpO2 97 %.  General: Alert, interactive, in no acute distress. HEENT: PERRLA, TMs pearly gray, turbinates minimally edematous without discharge, post-pharynx non erythematous. Neck: Supple without lymphadenopathy. Lungs: Clear to auscultation without wheezing, rhonchi or rales. {no increased work of breathing. CV:  Normal S1, S2 with a soft systolic ejection murmur Abdomen: Nondistended, nontender. Skin: Warm and dry, without lesions or rashes. Extremities:  No clubbing, cyanosis or edema. Neuro:   Grossly intact.  Diagnositics/Labs: None today  Assessment and plan:    Allergic rhinitis with conjunctivitis  - continue avoidance measures for grass pollens, weed pollens, tree pollen, dust mite and mold  - continue allergen immunotherapy (allergy shots) per schedule.  Building up  well.  Have access to epinephrine device on your injection days.    - continue Allegra 180mg  daily  - for itchy/watery/red eyes Pataday 1 drop each eye daily as needed is a good option to continue as needed use  Reflux  - symptoms improved on omeprazole however discussed potential long-term risk including reported connection with dementia and memory in the elderly population.    - at this time recommend stopping omperazole and change over to Pepcid 20mg  1-2 times a day for more long term control of reflux  - continue your lifestyle modifications including not eating before bedtime and avoiding foods that trigger reflux  Follow-up in 4-6 months or sooner if needed   I appreciate the opportunity to take part in Gloria Dalton's care. Please do not hesitate to contact me with questions.  Sincerely,   , MD Allergy/Immunology [facility]

## 2019-11-25 DIAGNOSIS — R011 Cardiac murmur, unspecified: Secondary | ICD-10-CM | POA: Insufficient documentation

## 2019-11-25 DIAGNOSIS — Z6841 Body Mass Index (BMI) 40.0 and over, adult: Secondary | ICD-10-CM | POA: Insufficient documentation

## 2019-11-25 DIAGNOSIS — T7840XA Allergy, unspecified, initial encounter: Secondary | ICD-10-CM | POA: Insufficient documentation

## 2019-11-25 DIAGNOSIS — E7800 Pure hypercholesterolemia, unspecified: Secondary | ICD-10-CM | POA: Insufficient documentation

## 2019-11-25 DIAGNOSIS — R600 Localized edema: Secondary | ICD-10-CM | POA: Insufficient documentation

## 2019-11-25 DIAGNOSIS — E785 Hyperlipidemia, unspecified: Secondary | ICD-10-CM | POA: Insufficient documentation

## 2019-11-25 DIAGNOSIS — R739 Hyperglycemia, unspecified: Secondary | ICD-10-CM | POA: Insufficient documentation

## 2019-11-25 DIAGNOSIS — J309 Allergic rhinitis, unspecified: Secondary | ICD-10-CM | POA: Insufficient documentation

## 2019-11-25 DIAGNOSIS — G4730 Sleep apnea, unspecified: Secondary | ICD-10-CM | POA: Insufficient documentation

## 2019-11-25 DIAGNOSIS — I10 Essential (primary) hypertension: Secondary | ICD-10-CM | POA: Insufficient documentation

## 2019-11-26 ENCOUNTER — Ambulatory Visit: Payer: Self-pay | Admitting: Cardiology

## 2019-11-26 ENCOUNTER — Ambulatory Visit (INDEPENDENT_AMBULATORY_CARE_PROVIDER_SITE_OTHER): Payer: BC Managed Care – PPO

## 2019-11-26 DIAGNOSIS — J3089 Other allergic rhinitis: Secondary | ICD-10-CM | POA: Diagnosis not present

## 2019-11-27 ENCOUNTER — Encounter: Payer: Self-pay | Admitting: Cardiology

## 2019-11-27 ENCOUNTER — Ambulatory Visit (INDEPENDENT_AMBULATORY_CARE_PROVIDER_SITE_OTHER): Payer: BC Managed Care – PPO | Admitting: Cardiology

## 2019-11-27 ENCOUNTER — Other Ambulatory Visit: Payer: Self-pay

## 2019-11-27 VITALS — BP 116/62 | HR 66 | Ht 65.0 in | Wt 261.4 lb

## 2019-11-27 DIAGNOSIS — I10 Essential (primary) hypertension: Secondary | ICD-10-CM | POA: Diagnosis not present

## 2019-11-27 DIAGNOSIS — R011 Cardiac murmur, unspecified: Secondary | ICD-10-CM

## 2019-11-27 DIAGNOSIS — E782 Mixed hyperlipidemia: Secondary | ICD-10-CM

## 2019-11-27 NOTE — Patient Instructions (Signed)
Medication Instructions:  Your physician recommends that you continue on your current medications as directed. Please refer to the Current Medication list given to you today.  *If you need a refill on your cardiac medications before your next appointment, please call your pharmacy*   Lab Work: None If you have labs (blood work) drawn today and your tests are completely normal, you will receive your results only by: Marland Kitchen MyChart Message (if you have MyChart) OR . A paper copy in the mail If you have any lab test that is abnormal or we need to change your treatment, we will call you to review the results.   Testing/Procedures: Your physician has requested that you have an echocardiogram. Echocardiography is a painless test that uses sound waves to create images of your heart. It provides your doctor with information about the size and shape of your heart and how well your heart's chambers and valves are working. This procedure takes approximately one hour. There are no restrictions for this procedure.     Follow-Up: At [facility], you and your health needs are our priority.  As part of our continuing mission to provide you with exceptional heart care, we have created designated Provider Care Teams.  These Care Teams include your primary Cardiologist (physician) and Advanced Practice Providers (APPs -  Physician Assistants and Nurse Practitioners) who all work together to provide you with the care you need, when you need it.  We recommend signing up for the patient portal called "MyChart".  Sign up information is provided on this After Visit Summary.  MyChart is used to connect with patients for Virtual Visits (Telemedicine).  Patients are able to view lab/test results, encounter notes, upcoming appointments, etc.  Non-urgent messages can be sent to your provider as well.   To learn more about what you can do with MyChart, go to [email or web address].    Your next appointment:   Two-Three  days after echo appointment   The format for your next appointment:   In Person  Provider:   Thomasene Ripple, DO   Other Instructions

## 2019-11-27 NOTE — Progress Notes (Signed)
Cardiology Office Note:    Date:  11/27/2019   ID:  Gloria Dalton, DOB 11-13-1970, MRN [id number]  PCP:  Alinda Deem, MD  Cardiologist:  Thomasene Ripple, DO  Electrophysiologist:  None   Referring MD: Alinda Deem, MD   " I am was sent because of a heart murmur"  History of Present Illness:    Gloria Dalton is a 49 y.o. female with a hx of hypertension, hyperlipidemia and obesity presents today at the request of her pcp due to a heart murmur. She denies any chest pain, shortness of breath, lightheadedness or dizziness.    Past Medical History:  Diagnosis Date  . Allergic rhinitis   . Allergies   . BMI 40.0-44.9, adult (HCC)   . Cardiac murmur   . Essential hypertension   . High blood pressure   . High cholesterol   . Hyperglycemia, unspecified   . Hyperlipidemia   . Hypertension   . Localized edema   . Obesity, morbid (HCC)   . Sleep apnea     Past Surgical History:  Procedure Laterality Date  . BREAST LUMPECTOMY    . PARTIAL HYSTERECTOMY     age 66  . TONSILLECTOMY    . UTERINE FIBROID SURGERY      Current Medications: Current Meds  Medication Sig  . famotidine (PEPCID) 20 MG tablet Take one tablet by mouth one to two times daily as directed.  . fexofenadine (ALLEGRA) 180 MG tablet Take 180 mg by mouth daily.  . haloperidol decanoate (HALDOL DECANOATE) 100 MG/ML injection Inject 50 mg into the muscle every 28 (twenty-eight) days.  . hydrochlorothiazide (HYDRODIURIL) 12.5 MG tablet Take 12.5 mg by mouth daily.  . metoprolol succinate (TOPROL-XL) 50 MG 24 hr tablet Take 50 mg by mouth daily.  . Olopatadine HCl 0.2 % SOLN Can use one drop in each eye once daily if needed for itchy, watery, red eyes.  . rosuvastatin (CRESTOR) 20 MG tablet Take 20 mg by mouth at bedtime.     Allergies:   Aspirin, Benicar [olmesartan], Fluticasone propionate, Nasonex [mometasone], and Penicillins   Social History   Socioeconomic History  . Marital status: Single    Spouse  name: Not on file  . Number of children: Not on file  . Years of education: Not on file  . Highest education level: Not on file  Occupational History  . Not on file  Tobacco Use  . Smoking status: Never Smoker  . Smokeless tobacco: Never Used  Substance and Sexual Activity  . Alcohol use: Never  . Drug use: Never  . Sexual activity: Not on file  Other Topics Concern  . Not on file  Social History Narrative  . Not on file   Social Determinants of Health   Financial Resource Strain:   . Difficulty of Paying Living Expenses: Not on file  Food Insecurity:   . Worried About Programme researcher, broadcasting/film/video in the Last Year: Not on file  . Ran Out of Food in the Last Year: Not on file  Transportation Needs:   . Lack of Transportation (Medical): Not on file  . Lack of Transportation (Non-Medical): Not on file  Physical Activity:   . Days of Exercise per Week: Not on file  . Minutes of Exercise per Session: Not on file  Stress:   . Feeling of Stress : Not on file  Social Connections:   . Frequency of Communication with Friends and Family: Not on file  . Frequency of  Social Gatherings with Friends and Family: Not on file  . Attends Religious Services: Not on file  . Active Member of Clubs or Organizations: Not on file  . Attends BankerClub or Organization Meetings: Not on file  . Marital Status: Not on file     Family History: The patient's family history includes Breast cancer in her maternal aunt, maternal aunt, and mother; Heart attack in her father and maternal grandfather; High blood pressure in her father and paternal grandfather; Stroke in her maternal grandmother.  ROS:   Review of Systems  Constitution: Negative for decreased appetite, fever and weight gain.  HENT: Negative for congestion, ear discharge, hoarse voice and sore throat.   Eyes: Negative for discharge, redness, vision loss in right eye and visual halos.  Cardiovascular: Negative for chest pain, dyspnea on exertion, leg  swelling, orthopnea and palpitations.  Respiratory: Negative for cough, hemoptysis, shortness of breath and snoring.   Endocrine: Negative for heat intolerance and polyphagia.  Hematologic/Lymphatic: Negative for bleeding problem. Does not bruise/bleed easily.  Skin: Negative for flushing, nail changes, rash and suspicious lesions.  Musculoskeletal: Negative for arthritis, joint pain, muscle cramps, myalgias, neck pain and stiffness.  Gastrointestinal: Negative for abdominal pain, bowel incontinence, diarrhea and excessive appetite.  Genitourinary: Negative for decreased libido, genital sores and incomplete emptying.  Neurological: Negative for brief paralysis, focal weakness, headaches and loss of balance.  Psychiatric/Behavioral: Negative for altered mental status, depression and suicidal ideas.  Allergic/Immunologic: Negative for HIV exposure and persistent infections.    EKGs/Labs/Other Studies Reviewed:    The following studies were reviewed today:   EKG:  The ekg ordered today demonstrates Normal sinus rhythm 66 bpm with no prior ekg to compared.  Recent Labs: No results found for requested labs within last 8760 hours.  Recent Lipid Panel No results found for: CHOL, TRIG, HDL, CHOLHDL, VLDL, LDLCALC, LDLDIRECT  Physical Exam:    VS:  BP 116/62 (BP Location: Left Arm, Patient Position: Sitting, Cuff Size: Large)   Pulse 66   Ht 5\' 5"  (1.651 m)   Wt 261 lb 6.4 oz (118.6 kg)   SpO2 98%   BMI 43.50 kg/m     Wt Readings from Last 3 Encounters:  11/27/19 261 lb 6.4 oz (118.6 kg)  07/07/19 260 lb (117.9 kg)     GEN: Well nourished, well developed in no acute distress HEENT: Normal NECK: No JVD; No carotid bruits LYMPHATICS: No lymphadenopathy CARDIAC: S1S2 noted,RRR, 2/6 mid systolic ejection murmurs, rubs, gallops RESPIRATORY:  Clear to auscultation without rales, wheezing or rhonchi  ABDOMEN: Soft, non-tender, non-distended, +bowel sounds, no guarding. EXTREMITIES: No  edema, No cyanosis, no clubbing MUSCULOSKELETAL:  No deformity  SKIN: Warm and dry NEUROLOGIC:  Alert and oriented x 3, non-focal PSYCHIATRIC:  Normal affect, good insight  ASSESSMENT:    1. Cardiac murmur   2. Primary hypertension   3. Mixed hyperlipidemia   4. Obesity, morbid (HCC)    PLAN:    I have discussed with the patient of her murmur, I will get her set up with an echo in the office to assess her for any structural heart disease.   Her blood pressure is acceptable, no changes will be made to her medication.   Continue with her crestor.  The patient understands the need to lose weight with diet and exercise. We have discussed specific strategies for this.   The patient is in agreement with the above plan. The patient left the office in stable condition.  The  patient will follow up in 2 days after her echo.  Medication Adjustments/Labs and Tests Ordered: Current medicines are reviewed at length with the patient today.  Concerns regarding medicines are outlined above.  Orders Placed This Encounter  Procedures  . ECHOCARDIOGRAM COMPLETE   No orders of the defined types were placed in this encounter.   Patient Instructions  Medication Instructions:  Your physician recommends that you continue on your current medications as directed. Please refer to the Current Medication list given to you today.  *If you need a refill on your cardiac medications before your next appointment, please call your pharmacy*   Lab Work: None If you have labs (blood work) drawn today and your tests are completely normal, you will receive your results only by: Marland Kitchen MyChart Message (if you have MyChart) OR . A paper copy in the mail If you have any lab test that is abnormal or we need to change your treatment, we will call you to review the results.   Testing/Procedures: Your physician has requested that you have an echocardiogram. Echocardiography is a painless test that uses sound waves to  create images of your heart. It provides your doctor with information about the size and shape of your heart and how well your heart's chambers and valves are working. This procedure takes approximately one hour. There are no restrictions for this procedure.     Follow-Up: At [facility], you and your health needs are our priority.  As part of our continuing mission to provide you with exceptional heart care, we have created designated Provider Care Teams.  These Care Teams include your primary Cardiologist (physician) and Advanced Practice Providers (APPs -  Physician Assistants and Nurse Practitioners) who all work together to provide you with the care you need, when you need it.  We recommend signing up for the patient portal called "MyChart".  Sign up information is provided on this After Visit Summary.  MyChart is used to connect with patients for Virtual Visits (Telemedicine).  Patients are able to view lab/test results, encounter notes, upcoming appointments, etc.  Non-urgent messages can be sent to your provider as well.   To learn more about what you can do with MyChart, go to [email or web address].    Your next appointment:   Two-Three days after echo appointment   The format for your next appointment:   In Person  Provider:   Thomasene Ripple, DO   Other Instructions      Adopting a Healthy Lifestyle.  Know what a healthy weight is for you (roughly BMI <25) and aim to maintain this   Aim for 7+ servings of fruits and vegetables daily   65-80+ fluid ounces of water or unsweet tea for healthy kidneys   Limit to max 1 drink of alcohol per day; avoid smoking/tobacco   Limit animal fats in diet for cholesterol and heart health - choose grass fed whenever available   Avoid highly processed foods, and foods high in saturated/trans fats   Aim for low stress - take time to unwind and care for your mental health   Aim for 150 min of moderate intensity exercise weekly for  heart health, and weights twice weekly for bone health   Aim for 7-9 hours of sleep daily   When it comes to diets, agreement about the perfect plan isnt easy to find, even among the experts. Experts at the [facility] [employer] developed an idea known as the Healthy Eating Plate. Just imagine a  plate divided into logical, healthy portions.   The emphasis is on diet quality:   Load up on vegetables and fruits - one-half of your plate: Aim for color and variety, and remember that potatoes dont count.   Go for whole grains - one-quarter of your plate: Whole wheat, barley, wheat berries, quinoa, oats, brown rice, and foods made with them. If you want pasta, go with whole wheat pasta.   Protein power - one-quarter of your plate: Fish, chicken, beans, and nuts are all healthy, versatile protein sources. Limit red meat.   The diet, however, does go beyond the plate, offering a few other suggestions.   Use healthy plant oils, such as olive, canola, soy, corn, sunflower and peanut. Check the labels, and avoid partially hydrogenated oil, which have unhealthy trans fats.   If youre thirsty, drink water. Coffee and tea are good in moderation, but skip sugary drinks and limit milk and dairy products to one or two daily servings.   The type of carbohydrate in the diet is more important than the amount. Some sources of carbohydrates, such as vegetables, fruits, whole grains, and beans-are healthier than others.   Finally, stay active  Signed, Thomasene Ripple, DO  11/27/2019 7:39 PM    [facility]

## 2019-12-02 NOTE — Addendum Note (Signed)
Addended by: Roosvelt Harps R on: 12/02/2019 03:21 PM   Modules accepted: Orders

## 2019-12-03 ENCOUNTER — Telehealth: Payer: Self-pay | Admitting: Allergy

## 2019-12-03 NOTE — Telephone Encounter (Signed)
Ok noted  

## 2019-12-03 NOTE — Telephone Encounter (Signed)
Gloria Dalton called in and stated ever since she started Pepcid she has become constipated. Gloria Dalton states she looked up the side effects and noticed the first one that popped up was can cause constipation.  She states she has gotten "no relief" and has decided to stop taking Pepcid.  She states she is going to start taking a probiotic again.

## 2019-12-10 ENCOUNTER — Ambulatory Visit: Payer: Self-pay | Admitting: Cardiology

## 2019-12-10 ENCOUNTER — Other Ambulatory Visit: Payer: Self-pay

## 2019-12-10 ENCOUNTER — Ambulatory Visit (INDEPENDENT_AMBULATORY_CARE_PROVIDER_SITE_OTHER): Payer: BC Managed Care – PPO

## 2019-12-10 DIAGNOSIS — R011 Cardiac murmur, unspecified: Secondary | ICD-10-CM

## 2019-12-10 LAB — ECHOCARDIOGRAM COMPLETE
Area-P 1/2: 3.48 cm2
S' Lateral: 2.6 cm

## 2019-12-10 NOTE — Progress Notes (Signed)
Complete echocardiogram performed.  Jimmy Kaylianna Detert RDCS, RVT  

## 2019-12-11 ENCOUNTER — Ambulatory Visit (INDEPENDENT_AMBULATORY_CARE_PROVIDER_SITE_OTHER): Payer: BC Managed Care – PPO

## 2019-12-11 ENCOUNTER — Telehealth: Payer: Self-pay | Admitting: Cardiology

## 2019-12-11 ENCOUNTER — Telehealth: Payer: Self-pay

## 2019-12-11 DIAGNOSIS — J309 Allergic rhinitis, unspecified: Secondary | ICD-10-CM

## 2019-12-11 DIAGNOSIS — Z1231 Encounter for screening mammogram for malignant neoplasm of breast: Secondary | ICD-10-CM | POA: Diagnosis not present

## 2019-12-11 NOTE — Telephone Encounter (Signed)
-----   Message from Thomasene Ripple, DO sent at 12/11/2019  9:24 AM EDT ----- The echo showed that the heart is not fully relaxing like it should ( diastolic dysfunction) ,but otherwise normal. I will discuss it at the next office visit.

## 2019-12-11 NOTE — Telephone Encounter (Signed)
   Pt is calling to get echo result, pt said if she unable to answer to cleave a detailed message on her VM or send her a mychart message of her result. She would like to have a layman's terms explanation of echo result

## 2019-12-11 NOTE — Telephone Encounter (Signed)
Spoke with patient regarding results and recommendation.  Patient verbalizes understanding and is agreeable to plan of care. Advised patient to call back with any issues or concerns.  

## 2019-12-11 NOTE — Telephone Encounter (Signed)
Left message on patients voicemail to please return our call.   

## 2019-12-14 ENCOUNTER — Telehealth: Payer: Self-pay

## 2019-12-14 NOTE — Telephone Encounter (Signed)
Called pt and attempted to discuss her concerns with her MyChart message. Pt states that she did not have time to discuss the report at this time and will callback when she is off work and has time to discuss. I will send her a message in MyChart.

## 2019-12-14 NOTE — Telephone Encounter (Signed)
Left vm to callback to discuss her echo.

## 2019-12-21 ENCOUNTER — Telehealth: Payer: Self-pay | Admitting: Allergy

## 2019-12-21 NOTE — Telephone Encounter (Signed)
Symphonie called in and stated she really likes Pataday.  Fredi states that she skipped a day and noticed her eyes were really itchy.  She can tell a difference from when she uses it.  Carnelia states she did some research with her insurance and they will cover a 90 day supply of Pataday cheaper than what she is paying now.  Nyelah would like to know if she can have a prescription written to [facility] in [place] for 90 day supply if Dr. Delorse Lek states she is supposed to use it.  Please advise.

## 2019-12-22 ENCOUNTER — Other Ambulatory Visit: Payer: BC Managed Care – PPO

## 2019-12-22 DIAGNOSIS — F209 Schizophrenia, unspecified: Secondary | ICD-10-CM | POA: Diagnosis not present

## 2019-12-22 NOTE — Telephone Encounter (Signed)
Left message for patient to call the office.  Need to verify which pharmacy...there is not [facility] [place].

## 2019-12-22 NOTE — Telephone Encounter (Signed)
Yes! Can send in a 90day supply for Pataday 1 drop each eye daily as needed for itchy/watery eyes.

## 2019-12-23 NOTE — Telephone Encounter (Signed)
Patient returned call and states she does not want prescription sent in at the moment. She states she is trying to get off them. She will call us back if she changes her mind.

## 2019-12-23 NOTE — Telephone Encounter (Signed)
Ok then.

## 2019-12-24 ENCOUNTER — Ambulatory Visit (INDEPENDENT_AMBULATORY_CARE_PROVIDER_SITE_OTHER): Payer: BC Managed Care – PPO | Admitting: *Deleted

## 2019-12-24 DIAGNOSIS — G4733 Obstructive sleep apnea (adult) (pediatric): Secondary | ICD-10-CM | POA: Diagnosis not present

## 2019-12-24 DIAGNOSIS — J309 Allergic rhinitis, unspecified: Secondary | ICD-10-CM | POA: Diagnosis not present

## 2019-12-24 DIAGNOSIS — I10 Essential (primary) hypertension: Secondary | ICD-10-CM | POA: Diagnosis not present

## 2020-01-07 ENCOUNTER — Other Ambulatory Visit: Payer: BC Managed Care – PPO

## 2020-01-18 ENCOUNTER — Other Ambulatory Visit: Payer: Self-pay

## 2020-01-20 ENCOUNTER — Other Ambulatory Visit: Payer: Self-pay

## 2020-01-20 ENCOUNTER — Ambulatory Visit (INDEPENDENT_AMBULATORY_CARE_PROVIDER_SITE_OTHER): Payer: BC Managed Care – PPO | Admitting: Cardiology

## 2020-01-20 ENCOUNTER — Encounter: Payer: Self-pay | Admitting: Cardiology

## 2020-01-20 VITALS — BP 116/86 | HR 85 | Ht 65.0 in | Wt 253.0 lb

## 2020-01-20 DIAGNOSIS — I10 Essential (primary) hypertension: Secondary | ICD-10-CM | POA: Diagnosis not present

## 2020-01-20 DIAGNOSIS — Z6841 Body Mass Index (BMI) 40.0 and over, adult: Secondary | ICD-10-CM | POA: Diagnosis not present

## 2020-01-20 DIAGNOSIS — E782 Mixed hyperlipidemia: Secondary | ICD-10-CM

## 2020-01-20 NOTE — Patient Instructions (Signed)

## 2020-01-20 NOTE — Progress Notes (Signed)
Cardiology Office Note:    Date:  01/20/2020   ID:  Gloria Dalton, DOB August 16, 1970, MRN [id number]  PCP:  Alinda Deem, MD  Cardiologist:  Thomasene Ripple, DO  Electrophysiologist:  None   Referring MD: Alinda Deem, MD   " I am doing well"  History of Present Illness:    Gloria Dalton is a 49 y.o. female with a hx of hypertension, hyperlipidemia and obesity and is here today for follow-up visit.  The last time I saw the patient he was in consultation  Past Medical History:  Diagnosis Date  . Allergic rhinitis   . Allergies   . BMI 40.0-44.9, adult (HCC)   . Cardiac murmur   . Essential hypertension   . High blood pressure   . High cholesterol   . Hyperglycemia, unspecified   . Hyperlipidemia   . Hypertension   . Localized edema   . Obesity, morbid (HCC)   . Sleep apnea     Past Surgical History:  Procedure Laterality Date  . BREAST LUMPECTOMY    . PARTIAL HYSTERECTOMY     age 60  . TONSILLECTOMY    . UTERINE FIBROID SURGERY      Current Medications: Current Meds  Medication Sig  . haloperidol decanoate (HALDOL DECANOATE) 100 MG/ML injection Inject 50 mg into the muscle every 28 (twenty-eight) days.  . Multiple Vitamin (MULTIVITAMIN) tablet Take 1 tablet by mouth daily.     Allergies:   Aspirin, Benicar [olmesartan], Fluticasone propionate, Nasonex [mometasone], and Penicillins   Social History   Socioeconomic History  . Marital status: Single    Spouse name: Not on file  . Number of children: Not on file  . Years of education: Not on file  . Highest education level: Not on file  Occupational History  . Not on file  Tobacco Use  . Smoking status: Never Smoker  . Smokeless tobacco: Never Used  Substance and Sexual Activity  . Alcohol use: Never  . Drug use: Never  . Sexual activity: Not on file  Other Topics Concern  . Not on file  Social History Narrative  . Not on file   Social Determinants of Health   Financial Resource Strain:   .  Difficulty of Paying Living Expenses: Not on file  Food Insecurity:   . Worried About Programme researcher, broadcasting/film/video in the Last Year: Not on file  . Ran Out of Food in the Last Year: Not on file  Transportation Needs:   . Lack of Transportation (Medical): Not on file  . Lack of Transportation (Non-Medical): Not on file  Physical Activity:   . Days of Exercise per Week: Not on file  . Minutes of Exercise per Session: Not on file  Stress:   . Feeling of Stress : Not on file  Social Connections:   . Frequency of Communication with Friends and Family: Not on file  . Frequency of Social Gatherings with Friends and Family: Not on file  . Attends Religious Services: Not on file  . Active Member of Clubs or Organizations: Not on file  . Attends Banker Meetings: Not on file  . Marital Status: Not on file     Family History: The patient's family history includes Breast cancer in her maternal aunt, maternal aunt, and mother; Heart attack in her father and maternal grandfather; High blood pressure in her father and paternal grandfather; Stroke in her maternal grandmother.  ROS:   Review of Systems  Constitution: Negative  for decreased appetite, fever and weight gain.  HENT: Negative for congestion, ear discharge, hoarse voice and sore throat.   Eyes: Negative for discharge, redness, vision loss in right eye and visual halos.  Cardiovascular: Negative for chest pain, dyspnea on exertion, leg swelling, orthopnea and palpitations.  Respiratory: Negative for cough, hemoptysis, shortness of breath and snoring.   Endocrine: Negative for heat intolerance and polyphagia.  Hematologic/Lymphatic: Negative for bleeding problem. Does not bruise/bleed easily.  Skin: Negative for flushing, nail changes, rash and suspicious lesions.  Musculoskeletal: Negative for arthritis, joint pain, muscle cramps, myalgias, neck pain and stiffness.  Gastrointestinal: Negative for abdominal pain, bowel incontinence,  diarrhea and excessive appetite.  Genitourinary: Negative for decreased libido, genital sores and incomplete emptying.  Neurological: Negative for brief paralysis, focal weakness, headaches and loss of balance.  Psychiatric/Behavioral: Negative for altered mental status, depression and suicidal ideas.  Allergic/Immunologic: Negative for HIV exposure and persistent infections.    EKGs/Labs/Other Studies Reviewed:    The following studies were reviewed today:   EKG: None today  TTE IMPRESSIONS  1. Left ventricular ejection fraction, by estimation, is 60 to 65%. The left ventricle has normal function. The left ventricle has no regional wall motion abnormalities. There is mild left ventricular hypertrophy. Left ventricular diastolic parameters are consistent with Grade I diastolic dysfunction (impaired relaxation).  2. Right ventricular systolic function is normal. The right ventricular size is normal. There is normal pulmonary artery systolic pressure.  3. The mitral valve is normal in structure. No evidence of mitral valve regurgitation. No evidence of mitral stenosis.  4. The aortic valve is normal in structure. Aortic valve regurgitation is not visualized. No aortic stenosis is present.  5. The inferior vena cava is normal in size with greater than 50% respiratory variability, suggesting right atrial pressure of 3 mmHg.   Recent Labs: No results found for requested labs within last 8760 hours.  Recent Lipid Panel No results found for: CHOL, TRIG, HDL, CHOLHDL, VLDL, LDLCALC, LDLDIRECT  Physical Exam:    VS:  BP 116/86   Pulse 85   Ht 5\' 5"  (1.651 m)   Wt 253 lb (114.8 kg)   SpO2 97%   BMI 42.10 kg/m     Wt Readings from Last 3 Encounters:  01/20/20 253 lb (114.8 kg)  11/27/19 261 lb 6.4 oz (118.6 kg)  07/07/19 260 lb (117.9 kg)     GEN: Well nourished, well developed in no acute distress HEENT: Normal NECK: No JVD; No carotid bruits LYMPHATICS: No  lymphadenopathy CARDIAC: S1S2 noted,RRR, no murmurs, rubs, gallops RESPIRATORY:  Clear to auscultation without rales, wheezing or rhonchi  ABDOMEN: Soft, non-tender, non-distended, +bowel sounds, no guarding. EXTREMITIES: No edema, No cyanosis, no clubbing MUSCULOSKELETAL:  No deformity  SKIN: Warm and dry NEUROLOGIC:  Alert and oriented x 3, non-focal PSYCHIATRIC:  Normal affect, good insight  ASSESSMENT:    1. Essential hypertension   2. BMI 40.0-44.9, adult (HCC)   3. Mixed hyperlipidemia    PLAN:     We can discuss the results from the patient echocardiogram does not show any significant valvular disease.  She does have diastolic dysfunction I explained this to the patient and she expresses understanding.  Her blood pressure is acceptable in the office today.  She tells me she has started doing some exercise.  The patient understands the need to lose weight with diet and exercise. We have discussed specific strategies for this.  I reviewed some of her blood pressure readings with  her we will continue patient on her current medication regimen which includes valsartan 160-Hydrocort thiazide 25 mg. Continue Crestor 20 mg daily.  The patient is in agreement with the above plan. The patient left the office in stable condition.  The patient will follow up in 12 months.   Medication Adjustments/Labs and Tests Ordered: Current medicines are reviewed at length with the patient today.  Concerns regarding medicines are outlined above.  No orders of the defined types were placed in this encounter.  No orders of the defined types were placed in this encounter.   Patient Instructions  Medication Instructions:  Your physician recommends that you continue on your current medications as directed. Please refer to the Current Medication list given to you today.  *If you need a refill on your cardiac medications before your next appointment, please call your pharmacy*   Lab Work: None If  you have labs (blood work) drawn today and your tests are completely normal, you will receive your results only by: Marland Kitchen MyChart Message (if you have MyChart) OR . A paper copy in the mail If you have any lab test that is abnormal or we need to change your treatment, we will call you to review the results.   Testing/Procedures: None   Follow-Up: At [facility], you and your health needs are our priority.  As part of our continuing mission to provide you with exceptional heart care, we have created designated Provider Care Teams.  These Care Teams include your primary Cardiologist (physician) and Advanced Practice Providers (APPs -  Physician Assistants and Nurse Practitioners) who all work together to provide you with the care you need, when you need it.  We recommend signing up for the patient portal called "MyChart".  Sign up information is provided on this After Visit Summary.  MyChart is used to connect with patients for Virtual Visits (Telemedicine).  Patients are able to view lab/test results, encounter notes, upcoming appointments, etc.  Non-urgent messages can be sent to your provider as well.   To learn more about what you can do with MyChart, go to [email or web address].    Your next appointment:   1 year(s)  The format for your next appointment:   In Person  Provider:   Thomasene Ripple, DO   Other Instructions      Adopting a Healthy Lifestyle.  Know what a healthy weight is for you (roughly BMI <25) and aim to maintain this   Aim for 7+ servings of fruits and vegetables daily   65-80+ fluid ounces of water or unsweet tea for healthy kidneys   Limit to max 1 drink of alcohol per day; avoid smoking/tobacco   Limit animal fats in diet for cholesterol and heart health - choose grass fed whenever available   Avoid highly processed foods, and foods high in saturated/trans fats   Aim for low stress - take time to unwind and care for your mental health   Aim for 150  min of moderate intensity exercise weekly for heart health, and weights twice weekly for bone health   Aim for 7-9 hours of sleep daily   When it comes to diets, agreement about the perfect plan isnt easy to find, even among the experts. Experts at the [facility] [employer] developed an idea known as the Healthy Eating Plate. Just imagine a plate divided into logical, healthy portions.   The emphasis is on diet quality:   Load up on vegetables and fruits - one-half of your  plate: Aim for color and variety, and remember that potatoes dont count.   Go for whole grains - one-quarter of your plate: Whole wheat, barley, wheat berries, quinoa, oats, brown rice, and foods made with them. If you want pasta, go with whole wheat pasta.   Protein power - one-quarter of your plate: Fish, chicken, beans, and nuts are all healthy, versatile protein sources. Limit red meat.   The diet, however, does go beyond the plate, offering a few other suggestions.   Use healthy plant oils, such as olive, canola, soy, corn, sunflower and peanut. Check the labels, and avoid partially hydrogenated oil, which have unhealthy trans fats.   If youre thirsty, drink water. Coffee and tea are good in moderation, but skip sugary drinks and limit milk and dairy products to one or two daily servings.   The type of carbohydrate in the diet is more important than the amount. Some sources of carbohydrates, such as vegetables, fruits, whole grains, and beans-are healthier than others.   Finally, stay active  Signed, Thomasene RippleKardie Kalyn Dimattia, DO  01/20/2020 1:27 PM    [facility]

## 2020-01-20 NOTE — Addendum Note (Signed)
Addended by: Delorse Limber I on: 01/20/2020 01:33 PM   Modules accepted: Orders

## 2020-01-21 ENCOUNTER — Ambulatory Visit (INDEPENDENT_AMBULATORY_CARE_PROVIDER_SITE_OTHER): Payer: BC Managed Care – PPO | Admitting: *Deleted

## 2020-01-21 DIAGNOSIS — J309 Allergic rhinitis, unspecified: Secondary | ICD-10-CM

## 2020-02-02 DIAGNOSIS — E7800 Pure hypercholesterolemia, unspecified: Secondary | ICD-10-CM | POA: Diagnosis not present

## 2020-02-02 DIAGNOSIS — F209 Schizophrenia, unspecified: Secondary | ICD-10-CM | POA: Diagnosis not present

## 2020-02-02 DIAGNOSIS — I10 Essential (primary) hypertension: Secondary | ICD-10-CM | POA: Diagnosis not present

## 2020-02-02 DIAGNOSIS — K219 Gastro-esophageal reflux disease without esophagitis: Secondary | ICD-10-CM | POA: Diagnosis not present

## 2020-02-03 ENCOUNTER — Ambulatory Visit (INDEPENDENT_AMBULATORY_CARE_PROVIDER_SITE_OTHER): Payer: BC Managed Care – PPO | Admitting: *Deleted

## 2020-02-03 DIAGNOSIS — J309 Allergic rhinitis, unspecified: Secondary | ICD-10-CM

## 2020-02-05 DIAGNOSIS — I10 Essential (primary) hypertension: Secondary | ICD-10-CM | POA: Diagnosis not present

## 2020-02-05 DIAGNOSIS — F209 Schizophrenia, unspecified: Secondary | ICD-10-CM | POA: Diagnosis not present

## 2020-02-05 DIAGNOSIS — Z6841 Body Mass Index (BMI) 40.0 and over, adult: Secondary | ICD-10-CM | POA: Diagnosis not present

## 2020-02-05 DIAGNOSIS — E7800 Pure hypercholesterolemia, unspecified: Secondary | ICD-10-CM | POA: Diagnosis not present

## 2020-02-16 DIAGNOSIS — F209 Schizophrenia, unspecified: Secondary | ICD-10-CM | POA: Diagnosis not present

## 2020-02-18 ENCOUNTER — Ambulatory Visit (INDEPENDENT_AMBULATORY_CARE_PROVIDER_SITE_OTHER): Payer: BC Managed Care – PPO | Admitting: *Deleted

## 2020-02-18 DIAGNOSIS — J309 Allergic rhinitis, unspecified: Secondary | ICD-10-CM

## 2020-03-03 ENCOUNTER — Ambulatory Visit (INDEPENDENT_AMBULATORY_CARE_PROVIDER_SITE_OTHER): Payer: BC Managed Care – PPO | Admitting: *Deleted

## 2020-03-03 DIAGNOSIS — J309 Allergic rhinitis, unspecified: Secondary | ICD-10-CM

## 2020-03-15 DIAGNOSIS — F209 Schizophrenia, unspecified: Secondary | ICD-10-CM | POA: Diagnosis not present

## 2020-03-15 DIAGNOSIS — G4733 Obstructive sleep apnea (adult) (pediatric): Secondary | ICD-10-CM | POA: Diagnosis not present

## 2020-03-17 ENCOUNTER — Ambulatory Visit (INDEPENDENT_AMBULATORY_CARE_PROVIDER_SITE_OTHER): Payer: BC Managed Care – PPO | Admitting: *Deleted

## 2020-03-17 ENCOUNTER — Telehealth: Payer: Self-pay | Admitting: Allergy

## 2020-03-17 DIAGNOSIS — J309 Allergic rhinitis, unspecified: Secondary | ICD-10-CM

## 2020-03-17 NOTE — Telephone Encounter (Signed)
Gloria Dalton came in to receive allergy injections and just wanted to update Dr. Delorse Lek and let her know that she is doing a lot better.  [name] states that she isn't sure if it is the medications or the allergy shots but "something is working"  [name] states she is coughing like she used to and she just "feels better" and wanted Dr. Delorse Lek to know.

## 2020-03-17 NOTE — Telephone Encounter (Signed)
That's great to hear.

## 2020-03-30 ENCOUNTER — Ambulatory Visit (INDEPENDENT_AMBULATORY_CARE_PROVIDER_SITE_OTHER): Payer: BC Managed Care – PPO | Admitting: *Deleted

## 2020-03-30 DIAGNOSIS — J309 Allergic rhinitis, unspecified: Secondary | ICD-10-CM

## 2020-04-12 DIAGNOSIS — F209 Schizophrenia, unspecified: Secondary | ICD-10-CM | POA: Diagnosis not present

## 2020-04-14 ENCOUNTER — Ambulatory Visit (INDEPENDENT_AMBULATORY_CARE_PROVIDER_SITE_OTHER): Payer: BC Managed Care – PPO | Admitting: *Deleted

## 2020-04-14 DIAGNOSIS — J309 Allergic rhinitis, unspecified: Secondary | ICD-10-CM | POA: Diagnosis not present

## 2020-04-15 DIAGNOSIS — G4733 Obstructive sleep apnea (adult) (pediatric): Secondary | ICD-10-CM | POA: Diagnosis not present

## 2020-04-27 ENCOUNTER — Ambulatory Visit (INDEPENDENT_AMBULATORY_CARE_PROVIDER_SITE_OTHER): Payer: BC Managed Care – PPO

## 2020-04-27 DIAGNOSIS — J309 Allergic rhinitis, unspecified: Secondary | ICD-10-CM | POA: Diagnosis not present

## 2020-05-10 ENCOUNTER — Ambulatory Visit (INDEPENDENT_AMBULATORY_CARE_PROVIDER_SITE_OTHER): Payer: BC Managed Care – PPO | Admitting: Allergy

## 2020-05-10 ENCOUNTER — Other Ambulatory Visit: Payer: Self-pay

## 2020-05-10 ENCOUNTER — Encounter: Payer: Self-pay | Admitting: Allergy

## 2020-05-10 VITALS — BP 124/82 | HR 83 | Resp 16 | Ht 65.0 in | Wt 248.2 lb

## 2020-05-10 DIAGNOSIS — K219 Gastro-esophageal reflux disease without esophagitis: Secondary | ICD-10-CM

## 2020-05-10 DIAGNOSIS — H1013 Acute atopic conjunctivitis, bilateral: Secondary | ICD-10-CM | POA: Diagnosis not present

## 2020-05-10 DIAGNOSIS — J3089 Other allergic rhinitis: Secondary | ICD-10-CM | POA: Diagnosis not present

## 2020-05-10 DIAGNOSIS — F209 Schizophrenia, unspecified: Secondary | ICD-10-CM | POA: Diagnosis not present

## 2020-05-10 NOTE — Progress Notes (Signed)
Follow-up Note  RE: KAREEN JEFFERYS MRN: [id number] DOB: 1970/12/31 Date of Office Visit: 05/10/2020   History of present illness: ZENOBIA KUENNEN is a 50 y.o. female presenting today for follow-up of allergic rhinitis with conjunctivitis and reflux.  She was last seen in the office on 11/24/2019 by myself.  She states she has been doing pretty well since her last visit without any major surgeries or hospitalizations. She states she hasn't been having issues with coughing or allergy symptoms at this time which is an improvement.  She is not needing to take allegra at this time.  She has not needed the eye drop yet either as not had any watery or red eyes.  She is on allergen immunotherapy and tolerating her injections well without any large local or systemic reactions.  Due to her work schedule she does come every other week for her buildup thus she will build up slower than usual but she is already having good effects with the immunotherapy thus far.  She does have access to an epinephrine device. She stopped using omeprazole.  She denies any issues with reflux at this time.       Review of systems: Review of Systems  Constitutional: Negative.   HENT: Negative.   Eyes: Negative.   Respiratory: Negative.   Cardiovascular: Negative.   Gastrointestinal: Negative.   Musculoskeletal: Negative.   Skin: Negative.   Neurological: Negative.     All other systems negative unless noted above in HPI  Past medical/social/surgical/family history have been reviewed and are unchanged unless specifically indicated below.  No changes  Medication List: Current Outpatient Medications  Medication Sig Dispense Refill  . haloperidol decanoate (HALDOL DECANOATE) 100 MG/ML injection Inject 50 mg into the muscle every 28 (twenty-eight) days.    . rosuvastatin (CRESTOR) 20 MG tablet Take 20 mg by mouth at bedtime.    . valsartan-hydrochlorothiazide (DIOVAN-HCT) 160-25 MG tablet Take 1 tablet by mouth  daily.     No current facility-administered medications for this visit.     Known medication allergies: Allergies  Allergen Reactions  . Aspirin Cough  . Benicar [Olmesartan] Other (See Comments)    Abdominal pain/severe  . Fluticasone Propionate   . Nasonex [Mometasone] Cough  . Penicillins Other (See Comments)    Patient unsure; gas/bloating     Physical examination: Blood pressure 124/82, pulse 83, resp. rate 16, height 5\' 5"  (1.651 m), weight 248 lb 3.2 oz (112.6 kg), SpO2 95 %.  General: Alert, interactive, in no acute distress. HEENT: PERRLA, TMs pearly gray, turbinates non-edematous without discharge, post-pharynx non erythematous. Neck: Supple without lymphadenopathy. Lungs: Clear to auscultation without wheezing, rhonchi or rales. {no increased work of breathing. CV: Normal S1, S2 with soft systolic murmur. Abdomen: Nondistended, nontender. Skin: Warm and dry, without lesions or rashes. Extremities:  No clubbing, cyanosis or edema. Neuro:   Grossly intact.  Diagnositics/Labs: None today  Assessment and plan:    Allergic rhinitis with conjunctivitis  - continue avoidance measures for grass pollens, weed pollens, tree pollen, dust mite and mold  - continue allergen immunotherapy (allergy shots) per schedule.  Building up well and showing good response at this time!  Have access to epinephrine device on your injection days.    - continue Allegra 180mg  daily  - for itchy/watery/red eyes Pataday 1 drop each eye daily as needed is a good option to continue as needed use  Reflux  - currently not having any issues    - can  use Pepcid 20mg  1-2 times a day as needed for reflux control  - continue your lifestyle modifications including not eating before bedtime and avoiding foods that trigger reflux  Follow-up in 6 months or sooner if needed   I appreciate the opportunity to take part in Yannis's care. Please do not hesitate to contact me with  questions.  Sincerely,   , MD Allergy/Immunology [facility]

## 2020-05-10 NOTE — Patient Instructions (Addendum)
Allergic rhinitis with conjunctivitis  - continue avoidance measures for grass pollens, weed pollens, tree pollen, dust mite and mold  - continue allergen immunotherapy (allergy shots) per schedule.  Building up well and showing good response at this time!  Have access to epinephrine device on your injection days.    - continue Allegra 180mg  daily  - for itchy/watery/red eyes Pataday 1 drop each eye daily as needed is a good option to continue as needed use  Reflux  - currently not having any issues    - can use Pepcid 20mg  1-2 times a day as needed for reflux control  - continue your lifestyle modifications including not eating before bedtime and avoiding foods that trigger reflux  Follow-up in 6 months or sooner if needed

## 2020-05-12 ENCOUNTER — Ambulatory Visit (INDEPENDENT_AMBULATORY_CARE_PROVIDER_SITE_OTHER): Payer: BC Managed Care – PPO | Admitting: *Deleted

## 2020-05-12 DIAGNOSIS — J309 Allergic rhinitis, unspecified: Secondary | ICD-10-CM

## 2020-05-13 DIAGNOSIS — G4733 Obstructive sleep apnea (adult) (pediatric): Secondary | ICD-10-CM | POA: Diagnosis not present

## 2020-05-26 ENCOUNTER — Ambulatory Visit (INDEPENDENT_AMBULATORY_CARE_PROVIDER_SITE_OTHER): Payer: BC Managed Care – PPO | Admitting: *Deleted

## 2020-05-26 DIAGNOSIS — J309 Allergic rhinitis, unspecified: Secondary | ICD-10-CM | POA: Diagnosis not present

## 2020-05-26 DIAGNOSIS — G4733 Obstructive sleep apnea (adult) (pediatric): Secondary | ICD-10-CM | POA: Diagnosis not present

## 2020-06-07 DIAGNOSIS — F209 Schizophrenia, unspecified: Secondary | ICD-10-CM | POA: Diagnosis not present

## 2020-06-09 ENCOUNTER — Ambulatory Visit (INDEPENDENT_AMBULATORY_CARE_PROVIDER_SITE_OTHER): Payer: BC Managed Care – PPO

## 2020-06-09 DIAGNOSIS — J309 Allergic rhinitis, unspecified: Secondary | ICD-10-CM | POA: Diagnosis not present

## 2020-06-13 DIAGNOSIS — G4733 Obstructive sleep apnea (adult) (pediatric): Secondary | ICD-10-CM | POA: Diagnosis not present

## 2020-06-22 ENCOUNTER — Ambulatory Visit (INDEPENDENT_AMBULATORY_CARE_PROVIDER_SITE_OTHER): Payer: BC Managed Care – PPO | Admitting: *Deleted

## 2020-06-22 DIAGNOSIS — J309 Allergic rhinitis, unspecified: Secondary | ICD-10-CM

## 2020-07-05 DIAGNOSIS — F209 Schizophrenia, unspecified: Secondary | ICD-10-CM | POA: Diagnosis not present

## 2020-07-07 ENCOUNTER — Ambulatory Visit (INDEPENDENT_AMBULATORY_CARE_PROVIDER_SITE_OTHER): Payer: BC Managed Care – PPO | Admitting: *Deleted

## 2020-07-07 DIAGNOSIS — J309 Allergic rhinitis, unspecified: Secondary | ICD-10-CM | POA: Diagnosis not present

## 2020-07-13 DIAGNOSIS — G4733 Obstructive sleep apnea (adult) (pediatric): Secondary | ICD-10-CM | POA: Diagnosis not present

## 2020-07-20 ENCOUNTER — Ambulatory Visit (INDEPENDENT_AMBULATORY_CARE_PROVIDER_SITE_OTHER): Payer: BC Managed Care – PPO | Admitting: *Deleted

## 2020-07-20 DIAGNOSIS — J309 Allergic rhinitis, unspecified: Secondary | ICD-10-CM

## 2020-07-28 DIAGNOSIS — J3089 Other allergic rhinitis: Secondary | ICD-10-CM | POA: Diagnosis not present

## 2020-07-28 NOTE — Progress Notes (Signed)
VIALS MADE.  EXP 07-28-21 

## 2020-08-02 DIAGNOSIS — F209 Schizophrenia, unspecified: Secondary | ICD-10-CM | POA: Diagnosis not present

## 2020-08-03 DIAGNOSIS — I10 Essential (primary) hypertension: Secondary | ICD-10-CM | POA: Diagnosis not present

## 2020-08-03 DIAGNOSIS — E7800 Pure hypercholesterolemia, unspecified: Secondary | ICD-10-CM | POA: Diagnosis not present

## 2020-08-03 DIAGNOSIS — Z6839 Body mass index (BMI) 39.0-39.9, adult: Secondary | ICD-10-CM | POA: Diagnosis not present

## 2020-08-03 DIAGNOSIS — F209 Schizophrenia, unspecified: Secondary | ICD-10-CM | POA: Diagnosis not present

## 2020-08-03 DIAGNOSIS — E669 Obesity, unspecified: Secondary | ICD-10-CM | POA: Diagnosis not present

## 2020-08-04 ENCOUNTER — Ambulatory Visit (INDEPENDENT_AMBULATORY_CARE_PROVIDER_SITE_OTHER): Payer: BC Managed Care – PPO | Admitting: *Deleted

## 2020-08-04 DIAGNOSIS — J309 Allergic rhinitis, unspecified: Secondary | ICD-10-CM | POA: Diagnosis not present

## 2020-08-13 DIAGNOSIS — G4733 Obstructive sleep apnea (adult) (pediatric): Secondary | ICD-10-CM | POA: Diagnosis not present

## 2020-08-30 DIAGNOSIS — F209 Schizophrenia, unspecified: Secondary | ICD-10-CM | POA: Diagnosis not present

## 2020-09-01 ENCOUNTER — Ambulatory Visit (INDEPENDENT_AMBULATORY_CARE_PROVIDER_SITE_OTHER): Payer: BC Managed Care – PPO | Admitting: *Deleted

## 2020-09-01 DIAGNOSIS — J309 Allergic rhinitis, unspecified: Secondary | ICD-10-CM | POA: Diagnosis not present

## 2020-09-12 DIAGNOSIS — G4733 Obstructive sleep apnea (adult) (pediatric): Secondary | ICD-10-CM | POA: Diagnosis not present

## 2020-09-14 ENCOUNTER — Ambulatory Visit (INDEPENDENT_AMBULATORY_CARE_PROVIDER_SITE_OTHER): Payer: BC Managed Care – PPO | Admitting: *Deleted

## 2020-09-14 DIAGNOSIS — J309 Allergic rhinitis, unspecified: Secondary | ICD-10-CM | POA: Diagnosis not present

## 2020-09-27 DIAGNOSIS — F209 Schizophrenia, unspecified: Secondary | ICD-10-CM | POA: Diagnosis not present

## 2020-10-11 ENCOUNTER — Ambulatory Visit (INDEPENDENT_AMBULATORY_CARE_PROVIDER_SITE_OTHER): Payer: BC Managed Care – PPO

## 2020-10-11 DIAGNOSIS — J309 Allergic rhinitis, unspecified: Secondary | ICD-10-CM | POA: Diagnosis not present

## 2020-10-13 DIAGNOSIS — G4733 Obstructive sleep apnea (adult) (pediatric): Secondary | ICD-10-CM | POA: Diagnosis not present

## 2020-10-25 DIAGNOSIS — F209 Schizophrenia, unspecified: Secondary | ICD-10-CM | POA: Diagnosis not present

## 2020-10-27 ENCOUNTER — Ambulatory Visit (INDEPENDENT_AMBULATORY_CARE_PROVIDER_SITE_OTHER): Payer: BC Managed Care – PPO | Admitting: *Deleted

## 2020-10-27 DIAGNOSIS — J309 Allergic rhinitis, unspecified: Secondary | ICD-10-CM | POA: Diagnosis not present

## 2020-11-09 ENCOUNTER — Ambulatory Visit (INDEPENDENT_AMBULATORY_CARE_PROVIDER_SITE_OTHER): Payer: BC Managed Care – PPO | Admitting: *Deleted

## 2020-11-09 DIAGNOSIS — J309 Allergic rhinitis, unspecified: Secondary | ICD-10-CM

## 2020-11-11 DIAGNOSIS — Z0000 Encounter for general adult medical examination without abnormal findings: Secondary | ICD-10-CM | POA: Diagnosis not present

## 2020-11-11 DIAGNOSIS — Z90710 Acquired absence of both cervix and uterus: Secondary | ICD-10-CM | POA: Diagnosis not present

## 2020-11-22 ENCOUNTER — Ambulatory Visit: Payer: BC Managed Care – PPO | Admitting: Allergy

## 2020-11-22 ENCOUNTER — Other Ambulatory Visit: Payer: Self-pay

## 2020-11-22 ENCOUNTER — Encounter: Payer: Self-pay | Admitting: Allergy

## 2020-11-22 VITALS — BP 108/72 | HR 80 | Resp 18 | Ht 65.5 in | Wt 245.0 lb

## 2020-11-22 DIAGNOSIS — K219 Gastro-esophageal reflux disease without esophagitis: Secondary | ICD-10-CM

## 2020-11-22 DIAGNOSIS — R202 Paresthesia of skin: Secondary | ICD-10-CM

## 2020-11-22 DIAGNOSIS — H1013 Acute atopic conjunctivitis, bilateral: Secondary | ICD-10-CM | POA: Diagnosis not present

## 2020-11-22 DIAGNOSIS — F259 Schizoaffective disorder, unspecified: Secondary | ICD-10-CM | POA: Diagnosis not present

## 2020-11-22 DIAGNOSIS — R200 Anesthesia of skin: Secondary | ICD-10-CM

## 2020-11-22 DIAGNOSIS — J3089 Other allergic rhinitis: Secondary | ICD-10-CM | POA: Diagnosis not present

## 2020-11-22 NOTE — Progress Notes (Signed)
Follow-up Note  RE: Gloria Dalton MRN: [id number] DOB: 02/07/1971 Date of Office Visit: 11/22/2020   History of present illness: Gloria Dalton is a 50 y.o. female presenting today for follow-up of allergic rhinitis with conjunctivitis and reflux.  She was last seen in the office on 05/10/2020 by myself. She has not had any major health changes, surgeries or hospitalizations since her last visit. She states she did have a bit of an allergy flare around September 12 when she was staying at [employer]. She states she was sneezing a lot and had some nasal congestion.  She took Careers adviser and this helped.  She states she wasn't the only person at the beach [place] that was sneezing.  She states she has not had to take additional allegra since being back from the beach.   She is on allergen immunotherapy at red vial 0.2 mL that she received at her last injection.  She is building up every other week due to her work schedule.  She is tolerating injections well without any large local or systemic reactions at this time. She has Pataday to use for as needed use for itchy watery eyes but has not needed to use this much. She will use Pepcid as needed for reflux control. She states states that she has been having some numbness/tingling sensation in her fingers and she is not sure if it related to her rosuvastatin that she has been taking.  She also reports having some joint aches in her index finger. She has follow-up with cardiology in December.  Review of systems in the past 4 weeks: Review of Systems  Constitutional: Negative.   HENT:         See HPI  Eyes: Negative.   Respiratory: Negative.    Cardiovascular: Negative.   Gastrointestinal: Negative.   Musculoskeletal: Negative.   Skin: Negative.   Neurological:  Positive for tingling.   All other systems negative unless noted above in HPI  Past medical/social/surgical/family history have been reviewed and are unchanged unless  specifically indicated below.  No changes  Medication List: Current Outpatient Medications  Medication Sig Dispense Refill   haloperidol decanoate (HALDOL DECANOATE) 100 MG/ML injection Inject 50 mg into the muscle every 28 (twenty-eight) days.     haloperidol decanoate (HALDOL DECANOATE) 50 MG/ML injection Inject into the muscle.     Multiple Vitamin (MULTIVITAMIN) tablet Take 1 tablet by mouth daily.     rosuvastatin (CRESTOR) 20 MG tablet Take 20 mg by mouth at bedtime.     valsartan-hydrochlorothiazide (DIOVAN-HCT) 320-25 MG tablet Take 1 tablet by mouth daily.     fexofenadine (ALLEGRA) 180 MG tablet Take 180 mg by mouth daily. (Patient not taking: Reported on 11/22/2020)     haloperidol (HALDOL) 2 MG tablet Take 2 mg by mouth at bedtime. (Patient not taking: No sig reported)     No current facility-administered medications for this visit.     Known medication allergies: Allergies  Allergen Reactions   Aspirin Cough   Benicar [Olmesartan] Other (See Comments)    Abdominal pain/severe   Fluticasone Propionate    Nasonex [Mometasone] Cough   Penicillins Other (See Comments)    Patient unsure; gas/bloating     Physical examination: Blood pressure 108/72, pulse 80, resp. rate 18, height 5' 5.5" (1.664 m), weight 245 lb (111.1 kg), SpO2 100 %.  General: Alert, interactive, in no acute distress. HEENT: PERRLA, TMs pearly gray, turbinates non-edematous without discharge, post-pharynx non erythematous. Neck: Supple  without lymphadenopathy. Lungs: Clear to auscultation without wheezing, rhonchi or rales. {no increased work of breathing. CV: Normal S1, S2 without murmurs. Abdomen: Nondistended, nontender. Skin: Warm and dry, without lesions or rashes. Extremities:  No clubbing, cyanosis or edema. Neuro:   Grossly intact.  Diagnositics/Labs: None today  Assessment and plan:  Allergic rhinitis with conjunctivitis  - continue avoidance measures for grass pollens, weed pollens,  tree pollen, dust mite and mold  - continue allergen immunotherapy (allergy shots) per schedule.  You are almost done with build-up phase!  Have access to epinephrine device on your injection days.    - continue Allegra 180mg  daily  - for itchy/watery/red eyes Pataday 1 drop each eye daily as needed is a good option to continue as needed use  Reflux  - currently not having any issues    - can use Pepcid 20mg  1-2 times a day as needed for reflux control  - continue your lifestyle modifications including not eating before bedtime and avoiding foods that trigger reflux  Numbness/tingling Advised her to discuss her concerns regarding the rosuvastatin and the numbness/tingling in her fingers with her PCP.  Follow-up in 6-12 months or sooner if needed   I appreciate the opportunity to take part in Ma's care. Please do not hesitate to contact me with questions.  Sincerely,   , MD Allergy/Immunology [facility]

## 2020-11-22 NOTE — Patient Instructions (Addendum)
Allergic rhinitis with conjunctivitis  - continue avoidance measures for grass pollens, weed pollens, tree pollen, dust mite and mold  - continue allergen immunotherapy (allergy shots) per schedule.  You are almost done with build-up phase!  Have access to epinephrine device on your injection days.    - continue Allegra 180mg  daily  - for itchy/watery/red eyes Pataday 1 drop each eye daily as needed is a good option to continue as needed use  Reflux  - currently not having any issues    - can use Pepcid 20mg  1-2 times a day as needed for reflux control  - continue your lifestyle modifications including not eating before bedtime and avoiding foods that trigger reflux  Follow-up in 6-12 months or sooner if needed

## 2020-11-28 DIAGNOSIS — G4733 Obstructive sleep apnea (adult) (pediatric): Secondary | ICD-10-CM | POA: Diagnosis not present

## 2020-12-05 ENCOUNTER — Ambulatory Visit (INDEPENDENT_AMBULATORY_CARE_PROVIDER_SITE_OTHER): Payer: BC Managed Care – PPO | Admitting: *Deleted

## 2020-12-05 DIAGNOSIS — J309 Allergic rhinitis, unspecified: Secondary | ICD-10-CM | POA: Diagnosis not present

## 2020-12-20 DIAGNOSIS — F209 Schizophrenia, unspecified: Secondary | ICD-10-CM | POA: Diagnosis not present

## 2020-12-21 DIAGNOSIS — Z1231 Encounter for screening mammogram for malignant neoplasm of breast: Secondary | ICD-10-CM | POA: Diagnosis not present

## 2020-12-22 ENCOUNTER — Ambulatory Visit (INDEPENDENT_AMBULATORY_CARE_PROVIDER_SITE_OTHER): Payer: BC Managed Care – PPO | Admitting: *Deleted

## 2020-12-22 DIAGNOSIS — J309 Allergic rhinitis, unspecified: Secondary | ICD-10-CM | POA: Diagnosis not present

## 2021-01-04 ENCOUNTER — Ambulatory Visit (INDEPENDENT_AMBULATORY_CARE_PROVIDER_SITE_OTHER): Payer: BC Managed Care – PPO | Admitting: *Deleted

## 2021-01-04 DIAGNOSIS — J309 Allergic rhinitis, unspecified: Secondary | ICD-10-CM

## 2021-01-17 DIAGNOSIS — F209 Schizophrenia, unspecified: Secondary | ICD-10-CM | POA: Diagnosis not present

## 2021-01-18 ENCOUNTER — Other Ambulatory Visit: Payer: Self-pay

## 2021-01-19 ENCOUNTER — Ambulatory Visit (INDEPENDENT_AMBULATORY_CARE_PROVIDER_SITE_OTHER): Payer: BC Managed Care – PPO | Admitting: *Deleted

## 2021-01-19 DIAGNOSIS — J309 Allergic rhinitis, unspecified: Secondary | ICD-10-CM

## 2021-01-20 ENCOUNTER — Encounter: Payer: Self-pay | Admitting: Cardiology

## 2021-01-20 ENCOUNTER — Other Ambulatory Visit: Payer: Self-pay

## 2021-01-20 ENCOUNTER — Ambulatory Visit (INDEPENDENT_AMBULATORY_CARE_PROVIDER_SITE_OTHER): Payer: BC Managed Care – PPO | Admitting: Cardiology

## 2021-01-20 VITALS — BP 124/84 | HR 71 | Ht 65.0 in | Wt 247.2 lb

## 2021-01-20 DIAGNOSIS — Z6841 Body Mass Index (BMI) 40.0 and over, adult: Secondary | ICD-10-CM

## 2021-01-20 DIAGNOSIS — G4730 Sleep apnea, unspecified: Secondary | ICD-10-CM | POA: Diagnosis not present

## 2021-01-20 DIAGNOSIS — I10 Essential (primary) hypertension: Secondary | ICD-10-CM | POA: Diagnosis not present

## 2021-01-20 NOTE — Progress Notes (Signed)
Cardiology Office Note:    Date:  01/20/2021   ID:  Gloria Dalton, DOB 1970-07-16, MRN [id number]  PCP:  Alinda Deem, MD  Cardiologist:  Garwin Brothers, MD   Referring MD: Alinda Deem, MD    ASSESSMENT:    1. Essential hypertension   2. BMI 40.0-44.9, adult (HCC)   3. Obesity, morbid (HCC)   4. Sleep apnea, unspecified type    PLAN:    In order of problems listed above:  Primary prevention stressed with patient.  Importance of compliance with diet medication stressed and she vocalized understanding.  She is advised to walk at least half an hour day 5 days a week and she promises to do so. Essential hypertension: Blood pressure stable and diet was emphasized.  Lifestyle modification urged. Mixed dyslipidemia: Lipids were reviewed from last year.  She plans to go to her primary care provider for annual physical soon.  She will send me a copy of those reports were reviewed. Morbid obesity: Risks of obesity revisited with exercise stressed and she promises to do better. Sleep apnea: She mentions to me that she is very meticulous with sleep apnea machine use and she finds it very refreshing when she uses it.  She is very compliant. Patient will be seen in follow-up appointment in 12 months or earlier if the patient has any concerns    Medication Adjustments/Labs and Tests Ordered: Current medicines are reviewed at length with the patient today.  Concerns regarding medicines are outlined above.  No orders of the defined types were placed in this encounter.  No orders of the defined types were placed in this encounter.    No chief complaint on file.    History of Present Illness:    Gloria Dalton is a 50 y.o. female.  Patient has past medical history of essential hypertension, mixed dyslipidemia, sleep apnea and morbid obesity.  She denies any problems at this time and takes care of activities of daily living.  No chest pain orthopnea or PND.  She is walking some on a  regular basis and is trying to step up with diet and exercise to lose weight.  At the time of my evaluation, the patient is alert awake oriented and in no distress.  She is previously unknown to me and was seen by my partner who has since moved her practice to [place].  Past Medical History:  Diagnosis Date   Allergic rhinitis    Allergies    BMI 40.0-44.9, adult (HCC)    Cardiac murmur    Essential hypertension    High blood pressure    High cholesterol    Hyperglycemia, unspecified    Hyperlipidemia    Hypertension    Localized edema    Obesity, morbid (HCC)    Sleep apnea     Past Surgical History:  Procedure Laterality Date   BREAST LUMPECTOMY     PARTIAL HYSTERECTOMY     age 63   TONSILLECTOMY     UTERINE FIBROID SURGERY      Current Medications: Current Meds  Medication Sig   fexofenadine (ALLEGRA) 180 MG tablet Take 180 mg by mouth daily as needed for allergies or rhinitis.   haloperidol (HALDOL) 2 MG tablet Take 2 mg by mouth at bedtime.   haloperidol decanoate (HALDOL DECANOATE) 50 MG/ML injection Inject 50 mg into the muscle every 30 (thirty) days.   Multiple Vitamin (MULTIVITAMIN) tablet Take 1 tablet by mouth daily.   rosuvastatin (CRESTOR) 20 MG  tablet Take 20 mg by mouth at bedtime.   valsartan-hydrochlorothiazide (DIOVAN-HCT) 320-25 MG tablet Take 1 tablet by mouth daily.     Allergies:   Aspirin, Benicar [olmesartan], Fluticasone propionate, Nasonex [mometasone], and Penicillins   Social History   Socioeconomic History   Marital status: Single    Spouse name: Not on file   Number of children: Not on file   Years of education: Not on file   Highest education level: Not on file  Occupational History   Not on file  Tobacco Use   Smoking status: Never    Passive exposure: Never   Smokeless tobacco: Never  Vaping Use   Vaping Use: Never used  Substance and Sexual Activity   Alcohol use: Never   Drug use: Never   Sexual activity: Not on file   Other Topics Concern   Not on file  Social History Narrative   Not on file   Social Determinants of Health   Financial Resource Strain: Not on file  Food Insecurity: Not on file  Transportation Needs: Not on file  Physical Activity: Not on file  Stress: Not on file  Social Connections: Not on file     Family History: The patient's family history includes Breast cancer in her maternal aunt, maternal aunt, and mother; Heart attack in her father and maternal grandfather; High blood pressure in her father and paternal grandfather; Stroke in her maternal grandmother.  ROS:   Please see the history of present illness.    All other systems reviewed and are negative.  EKGs/Labs/Other Studies Reviewed:    The following studies were reviewed today: EKG reveals sinus rhythm and nonspecific ST-T changes.  Echocardiogram was unremarkable.   Recent Labs: No results found for requested labs within last 8760 hours.  Recent Lipid Panel No results found for: CHOL, TRIG, HDL, CHOLHDL, VLDL, LDLCALC, LDLDIRECT  Physical Exam:    VS:  BP 124/84   Pulse 71   Ht 5\' 5"  (1.651 m)   Wt 247 lb 3.2 oz (112.1 kg)   SpO2 99%   BMI 41.14 kg/m     Wt Readings from Last 3 Encounters:  01/20/21 247 lb 3.2 oz (112.1 kg)  11/22/20 245 lb (111.1 kg)  05/10/20 248 lb 3.2 oz (112.6 kg)     GEN: Patient is in no acute distress HEENT: Normal NECK: No JVD; No carotid bruits LYMPHATICS: No lymphadenopathy CARDIAC: Hear sounds regular, 2/6 systolic murmur at the apex. RESPIRATORY:  Clear to auscultation without rales, wheezing or rhonchi  ABDOMEN: Soft, non-tender, non-distended MUSCULOSKELETAL:  No edema; No deformity  SKIN: Warm and dry NEUROLOGIC:  Alert and oriented x 3 PSYCHIATRIC:  Normal affect   Signed, 05/12/20, MD  01/20/2021 9:13 AM    [facility]

## 2021-01-20 NOTE — Patient Instructions (Signed)
Medication Instructions:  °Your physician recommends that you continue on your current medications as directed. Please refer to the Current Medication list given to you today. ° °*If you need a refill on your cardiac medications before your next appointment, please call your pharmacy* ° ° °Lab Work: °None °If you have labs (blood work) drawn today and your tests are completely normal, you will receive your results only by: °MyChart Message (if you have MyChart) OR °A paper copy in the mail °If you have any lab test that is abnormal or we need to change your treatment, we will call you to review the results. ° ° °Testing/Procedures: °None ° ° °Follow-Up: °At [facility], you and your health needs are our priority.  As part of our continuing mission to provide you with exceptional heart care, we have created designated Provider Care Teams.  These Care Teams include your primary Cardiologist (physician) and Advanced Practice Providers (APPs -  Physician Assistants and Nurse Practitioners) who all work together to provide you with the care you need, when you need it. ° °We recommend signing up for the patient portal called "MyChart".  Sign up information is provided on this After Visit Summary.  MyChart is used to connect with patients for Virtual Visits (Telemedicine).  Patients are able to view lab/test results, encounter notes, upcoming appointments, etc.  Non-urgent messages can be sent to your provider as well.   °To learn more about what you can do with MyChart, go to [email or web address].   ° °Your next appointment:   °1 year(s) ° °The format for your next appointment:   °In Person ° °Provider:   °Rajan Revankar, MD  ° ° °Other Instructions ° ° °

## 2021-02-01 ENCOUNTER — Ambulatory Visit (INDEPENDENT_AMBULATORY_CARE_PROVIDER_SITE_OTHER): Payer: BC Managed Care – PPO | Admitting: *Deleted

## 2021-02-01 DIAGNOSIS — J309 Allergic rhinitis, unspecified: Secondary | ICD-10-CM | POA: Diagnosis not present

## 2021-02-14 DIAGNOSIS — F209 Schizophrenia, unspecified: Secondary | ICD-10-CM | POA: Diagnosis not present

## 2021-02-16 ENCOUNTER — Encounter: Payer: Self-pay | Admitting: Student

## 2021-02-16 ENCOUNTER — Ambulatory Visit (INDEPENDENT_AMBULATORY_CARE_PROVIDER_SITE_OTHER): Payer: BC Managed Care – PPO

## 2021-02-16 DIAGNOSIS — J309 Allergic rhinitis, unspecified: Secondary | ICD-10-CM | POA: Diagnosis not present

## 2021-02-16 DIAGNOSIS — E7800 Pure hypercholesterolemia, unspecified: Secondary | ICD-10-CM | POA: Diagnosis not present

## 2021-02-16 DIAGNOSIS — F209 Schizophrenia, unspecified: Secondary | ICD-10-CM | POA: Diagnosis not present

## 2021-02-16 DIAGNOSIS — I10 Essential (primary) hypertension: Secondary | ICD-10-CM | POA: Diagnosis not present

## 2021-02-16 DIAGNOSIS — Z6841 Body Mass Index (BMI) 40.0 and over, adult: Secondary | ICD-10-CM | POA: Diagnosis not present

## 2021-03-01 ENCOUNTER — Ambulatory Visit (INDEPENDENT_AMBULATORY_CARE_PROVIDER_SITE_OTHER): Payer: BC Managed Care – PPO | Admitting: *Deleted

## 2021-03-01 ENCOUNTER — Telehealth: Payer: Self-pay | Admitting: *Deleted

## 2021-03-01 DIAGNOSIS — J309 Allergic rhinitis, unspecified: Secondary | ICD-10-CM | POA: Diagnosis not present

## 2021-03-01 NOTE — Telephone Encounter (Signed)
Pt called and states that after her last 2 times getting allergy injections it is making her very sleeping and she is wondering if that is causing the drowsiness. She also states she is taking fexofenadine daily due to this being one of her worst allergy seasons. I did explain that Fexofenadine is a non drowsy medication. She will continue to monitor her sx after her next allergy injections.

## 2021-03-14 ENCOUNTER — Telehealth: Payer: Self-pay | Admitting: Allergy

## 2021-03-14 DIAGNOSIS — F209 Schizophrenia, unspecified: Secondary | ICD-10-CM | POA: Diagnosis not present

## 2021-03-14 DIAGNOSIS — J3089 Other allergic rhinitis: Secondary | ICD-10-CM | POA: Diagnosis not present

## 2021-03-14 NOTE — Progress Notes (Signed)
VIALS MADE. EXP 03-14-22 °

## 2021-03-14 NOTE — Telephone Encounter (Signed)
Error

## 2021-03-14 NOTE — Telephone Encounter (Signed)
Patient called the office and requested I document and relay this information to Dr. Delorse Lek. She has held off on continuing her allergy medication (fexofenadine) because it causes her a lot of drowsiness. She used it for a little over a month and states with her medication and allergy injection she felt extra drowsy. She has stopped taking the allergy medication and is feeling/doing better. She will only be taking this when really needed.

## 2021-03-14 NOTE — Telephone Encounter (Signed)
fyi

## 2021-03-16 ENCOUNTER — Ambulatory Visit (INDEPENDENT_AMBULATORY_CARE_PROVIDER_SITE_OTHER): Payer: BC Managed Care – PPO | Admitting: *Deleted

## 2021-03-16 DIAGNOSIS — J309 Allergic rhinitis, unspecified: Secondary | ICD-10-CM | POA: Diagnosis not present

## 2021-03-29 ENCOUNTER — Other Ambulatory Visit: Payer: Self-pay | Admitting: *Deleted

## 2021-03-29 ENCOUNTER — Ambulatory Visit (INDEPENDENT_AMBULATORY_CARE_PROVIDER_SITE_OTHER): Payer: BC Managed Care – PPO | Admitting: *Deleted

## 2021-03-29 ENCOUNTER — Telehealth: Payer: Self-pay | Admitting: Allergy

## 2021-03-29 DIAGNOSIS — J309 Allergic rhinitis, unspecified: Secondary | ICD-10-CM | POA: Diagnosis not present

## 2021-03-29 MED ORDER — EPINEPHRINE 0.3 MG/0.3ML IJ SOAJ: INTRAMUSCULAR | 2 refills | Status: DC

## 2021-03-29 NOTE — Telephone Encounter (Signed)
RX sent to [facility].

## 2021-03-29 NOTE — Telephone Encounter (Signed)
Patients EpiPen is expiring in March. She is requesting a refill to be sent to [facility] in  [place].

## 2021-03-30 ENCOUNTER — Encounter: Payer: Self-pay | Admitting: Allergy

## 2021-04-11 DIAGNOSIS — F209 Schizophrenia, unspecified: Secondary | ICD-10-CM | POA: Diagnosis not present

## 2021-04-13 ENCOUNTER — Ambulatory Visit (INDEPENDENT_AMBULATORY_CARE_PROVIDER_SITE_OTHER): Payer: BC Managed Care – PPO | Admitting: *Deleted

## 2021-04-13 DIAGNOSIS — J309 Allergic rhinitis, unspecified: Secondary | ICD-10-CM

## 2021-04-25 ENCOUNTER — Ambulatory Visit (INDEPENDENT_AMBULATORY_CARE_PROVIDER_SITE_OTHER): Payer: BC Managed Care – PPO | Admitting: *Deleted

## 2021-04-25 ENCOUNTER — Ambulatory Visit: Payer: BC Managed Care – PPO | Admitting: Allergy

## 2021-04-25 DIAGNOSIS — J309 Allergic rhinitis, unspecified: Secondary | ICD-10-CM

## 2021-05-09 DIAGNOSIS — F209 Schizophrenia, unspecified: Secondary | ICD-10-CM | POA: Diagnosis not present

## 2021-05-11 ENCOUNTER — Ambulatory Visit (INDEPENDENT_AMBULATORY_CARE_PROVIDER_SITE_OTHER): Payer: BC Managed Care – PPO | Admitting: *Deleted

## 2021-05-11 DIAGNOSIS — J309 Allergic rhinitis, unspecified: Secondary | ICD-10-CM

## 2021-05-23 ENCOUNTER — Ambulatory Visit (INDEPENDENT_AMBULATORY_CARE_PROVIDER_SITE_OTHER): Payer: BC Managed Care – PPO | Admitting: Allergy

## 2021-05-23 ENCOUNTER — Encounter: Payer: Self-pay | Admitting: Allergy

## 2021-05-23 VITALS — BP 116/70 | HR 68 | Resp 16 | Wt 251.4 lb

## 2021-05-23 DIAGNOSIS — H1013 Acute atopic conjunctivitis, bilateral: Secondary | ICD-10-CM | POA: Diagnosis not present

## 2021-05-23 DIAGNOSIS — J309 Allergic rhinitis, unspecified: Secondary | ICD-10-CM | POA: Diagnosis not present

## 2021-05-23 DIAGNOSIS — K219 Gastro-esophageal reflux disease without esophagitis: Secondary | ICD-10-CM

## 2021-05-23 DIAGNOSIS — J3089 Other allergic rhinitis: Secondary | ICD-10-CM

## 2021-05-23 NOTE — Patient Instructions (Signed)
Allergic rhinitis with conjunctivitis ? - continue avoidance measures for grass pollens, weed pollens, tree pollen, dust mite and mold ? - continue allergen immunotherapy (allergy shots) per schedule.  You are in the full strength red vial! ? - continue Allegra 180mg  daily as needed ? - for itchy/watery/red eyes Pataday 1 drop each eye daily as needed is a good option to continue as needed use ? ?Reflux ? - currently not having any issues   ? - continue your lifestyle modifications including not eating before bedtime and avoiding foods that trigger reflux ? ?Follow-up in 6-12 months or sooner if needed ? ?

## 2021-05-23 NOTE — Progress Notes (Signed)
? ? ?Follow-up Note ? ?RE: Gloria Dalton MRN: [id number] DOB: 1970/05/29 ?Date of Office Visit: 05/23/2021 ? ? ?History of present illness: ?Gloria Dalton is a 51 y.o. female presenting today for follow-up of allergic rhinitis with conjunctivitis on immunotherapy.  She was last seen in the office on 11/22/2020 by myself. ?She states she has been doing well. Since the last visit she states she has had to take the allegra and the pataday eye drop for symptom control with cough and congestion only as needed on a couple occasions.  She states it helped her!  Thus she takes these medications as needed.  She is on allergen immunotherapy at red vial and she comes every other week due to her work schedule.  She is tolerating immunotherapy well without large local or systemic symptoms. ?She has not been having issues with reflux and thus is not taking any antireflux medication.  However she states she will not be taking pepcid as she should it up and did not like what she was reading in regards to the medication and adverse events.  ? ?Review of systems: ?Review of Systems  ?Constitutional: Negative.   ?HENT: Negative.    ?Eyes: Negative.   ?Respiratory: Negative.    ?Cardiovascular: Negative.   ?Gastrointestinal: Negative.   ?Musculoskeletal: Negative.   ?Skin: Negative.   ?Allergic/Immunologic: Negative.   ?Neurological: Negative.    ? ?All other systems negative unless noted above in HPI ? ?Past medical/social/surgical/family history have been reviewed and are unchanged unless specifically indicated below. ? ?No changes ? ?Medication List: ?Current Outpatient Medications  ?Medication Sig Dispense Refill  ? EPINEPHrine 0.3 mg/0.3 mL IJ SOAJ injection Use as directed for life threatening allergic reactions 2 each 2  ? fexofenadine (ALLEGRA) 180 MG tablet Take 180 mg by mouth daily as needed for allergies or rhinitis.    ? haloperidol (HALDOL) 2 MG tablet Take 2 mg by mouth at bedtime.    ? haloperidol decanoate (HALDOL  DECANOATE) 50 MG/ML injection Inject 50 mg into the muscle every 30 (thirty) days.    ? rosuvastatin (CRESTOR) 20 MG tablet Take 20 mg by mouth at bedtime.    ? valsartan-hydrochlorothiazide (DIOVAN-HCT) 320-25 MG tablet Take 1 tablet by mouth daily.    ? ?No current facility-administered medications for this visit.  ?  ? ?Known medication allergies: ?Allergies  ?Allergen Reactions  ? Aspirin Cough  ? Benicar [Olmesartan] Other (See Comments)  ?  Abdominal pain/severe  ? Fluticasone Propionate   ? Nasonex [Mometasone] Cough  ? Penicillins Other (See Comments)  ?  Patient unsure; gas/bloating  ? ? ? ?Physical examination: ?Blood pressure 116/70, pulse 68, resp. rate 16, weight 251 lb 6.4 oz (114 kg), SpO2 97 %. ? ?General: Alert, interactive, in no acute distress. ?HEENT: PERRLA, TMs pearly gray, turbinates non-edematous without discharge, post-pharynx non erythematous. ?Neck: Supple without lymphadenopathy. ?Lungs: Clear to auscultation without wheezing, rhonchi or rales. {no increased work of breathing. ?CV: Normal S1, S2 with soft systolic  murmurs. ?Abdomen: Nondistended, nontender. ?Skin: Warm and dry, without lesions or rashes. ?Extremities:  No clubbing, cyanosis or edema. ?Neuro:   Grossly intact. ? ?Diagnositics/Labs: ?Immunotherapy injections provided today  ? ?Assessment and plan: ?  ? Allergic rhinitis with conjunctivitis ? - continue avoidance measures for grass pollens, weed pollens, tree pollen, dust mite and mold ? - continue allergen immunotherapy (allergy shots) per schedule.  You are in the full strength red vial! ? - continue Allegra 180mg  daily as needed ? -  for itchy/watery/red eyes Pataday 1 drop each eye daily as needed is a good option to continue as needed use ? ?Reflux ? - currently not having any issues   ? - continue your lifestyle modifications including not eating before bedtime and avoiding foods that trigger reflux ? ?Follow-up in 6-12 months or sooner if needed ? ? ?I appreciate the  opportunity to take part in Gloria Dalton's care. Please do not hesitate to contact me with questions. ? ?Sincerely, ? ? ?Margo Aye, MD ?Allergy/Immunology ?[facility] ? ? ? ?

## 2021-06-06 DIAGNOSIS — F209 Schizophrenia, unspecified: Secondary | ICD-10-CM | POA: Diagnosis not present

## 2021-06-08 ENCOUNTER — Ambulatory Visit (INDEPENDENT_AMBULATORY_CARE_PROVIDER_SITE_OTHER): Payer: BC Managed Care – PPO | Admitting: *Deleted

## 2021-06-08 DIAGNOSIS — J309 Allergic rhinitis, unspecified: Secondary | ICD-10-CM | POA: Diagnosis not present

## 2021-06-22 ENCOUNTER — Ambulatory Visit (INDEPENDENT_AMBULATORY_CARE_PROVIDER_SITE_OTHER): Payer: BC Managed Care – PPO | Admitting: *Deleted

## 2021-06-22 DIAGNOSIS — J309 Allergic rhinitis, unspecified: Secondary | ICD-10-CM | POA: Diagnosis not present

## 2021-07-04 DIAGNOSIS — F209 Schizophrenia, unspecified: Secondary | ICD-10-CM | POA: Diagnosis not present

## 2021-07-06 ENCOUNTER — Ambulatory Visit (INDEPENDENT_AMBULATORY_CARE_PROVIDER_SITE_OTHER): Payer: BC Managed Care – PPO | Admitting: *Deleted

## 2021-07-06 DIAGNOSIS — J309 Allergic rhinitis, unspecified: Secondary | ICD-10-CM | POA: Diagnosis not present

## 2021-07-20 ENCOUNTER — Ambulatory Visit (INDEPENDENT_AMBULATORY_CARE_PROVIDER_SITE_OTHER): Payer: BC Managed Care – PPO | Admitting: *Deleted

## 2021-07-20 DIAGNOSIS — J309 Allergic rhinitis, unspecified: Secondary | ICD-10-CM | POA: Diagnosis not present

## 2021-07-21 DIAGNOSIS — G4733 Obstructive sleep apnea (adult) (pediatric): Secondary | ICD-10-CM | POA: Diagnosis not present

## 2021-08-01 DIAGNOSIS — F209 Schizophrenia, unspecified: Secondary | ICD-10-CM | POA: Diagnosis not present

## 2021-08-03 ENCOUNTER — Ambulatory Visit (INDEPENDENT_AMBULATORY_CARE_PROVIDER_SITE_OTHER): Payer: BC Managed Care – PPO | Admitting: *Deleted

## 2021-08-03 DIAGNOSIS — J309 Allergic rhinitis, unspecified: Secondary | ICD-10-CM

## 2021-08-09 DIAGNOSIS — J3089 Other allergic rhinitis: Secondary | ICD-10-CM | POA: Diagnosis not present

## 2021-08-09 NOTE — Progress Notes (Signed)
VIALS EXP 08-10-22 

## 2021-08-16 DIAGNOSIS — I10 Essential (primary) hypertension: Secondary | ICD-10-CM | POA: Diagnosis not present

## 2021-08-16 DIAGNOSIS — Z6841 Body Mass Index (BMI) 40.0 and over, adult: Secondary | ICD-10-CM | POA: Diagnosis not present

## 2021-08-16 DIAGNOSIS — E7800 Pure hypercholesterolemia, unspecified: Secondary | ICD-10-CM | POA: Diagnosis not present

## 2021-08-16 DIAGNOSIS — F209 Schizophrenia, unspecified: Secondary | ICD-10-CM | POA: Diagnosis not present

## 2021-08-17 ENCOUNTER — Ambulatory Visit (INDEPENDENT_AMBULATORY_CARE_PROVIDER_SITE_OTHER): Payer: BC Managed Care – PPO | Admitting: *Deleted

## 2021-08-17 DIAGNOSIS — J309 Allergic rhinitis, unspecified: Secondary | ICD-10-CM

## 2021-08-29 DIAGNOSIS — F209 Schizophrenia, unspecified: Secondary | ICD-10-CM | POA: Diagnosis not present

## 2021-08-31 ENCOUNTER — Ambulatory Visit (INDEPENDENT_AMBULATORY_CARE_PROVIDER_SITE_OTHER): Payer: BC Managed Care – PPO | Admitting: *Deleted

## 2021-08-31 DIAGNOSIS — J309 Allergic rhinitis, unspecified: Secondary | ICD-10-CM

## 2021-09-11 ENCOUNTER — Ambulatory Visit (INDEPENDENT_AMBULATORY_CARE_PROVIDER_SITE_OTHER): Payer: BC Managed Care – PPO | Admitting: *Deleted

## 2021-09-11 DIAGNOSIS — J309 Allergic rhinitis, unspecified: Secondary | ICD-10-CM | POA: Diagnosis not present

## 2021-09-13 DIAGNOSIS — G4733 Obstructive sleep apnea (adult) (pediatric): Secondary | ICD-10-CM | POA: Diagnosis not present

## 2021-09-14 ENCOUNTER — Ambulatory Visit: Payer: BC Managed Care – PPO | Admitting: Physician Assistant

## 2021-09-26 DIAGNOSIS — F209 Schizophrenia, unspecified: Secondary | ICD-10-CM | POA: Diagnosis not present

## 2021-09-28 ENCOUNTER — Ambulatory Visit (INDEPENDENT_AMBULATORY_CARE_PROVIDER_SITE_OTHER): Payer: BC Managed Care – PPO | Admitting: *Deleted

## 2021-09-28 DIAGNOSIS — J309 Allergic rhinitis, unspecified: Secondary | ICD-10-CM | POA: Diagnosis not present

## 2021-10-09 ENCOUNTER — Ambulatory Visit (INDEPENDENT_AMBULATORY_CARE_PROVIDER_SITE_OTHER): Payer: BC Managed Care – PPO

## 2021-10-09 DIAGNOSIS — J309 Allergic rhinitis, unspecified: Secondary | ICD-10-CM

## 2021-10-24 DIAGNOSIS — F209 Schizophrenia, unspecified: Secondary | ICD-10-CM | POA: Diagnosis not present

## 2021-10-26 ENCOUNTER — Ambulatory Visit (INDEPENDENT_AMBULATORY_CARE_PROVIDER_SITE_OTHER): Payer: BC Managed Care – PPO | Admitting: *Deleted

## 2021-10-26 DIAGNOSIS — J309 Allergic rhinitis, unspecified: Secondary | ICD-10-CM

## 2021-11-07 ENCOUNTER — Ambulatory Visit (INDEPENDENT_AMBULATORY_CARE_PROVIDER_SITE_OTHER): Payer: BC Managed Care – PPO

## 2021-11-07 DIAGNOSIS — J309 Allergic rhinitis, unspecified: Secondary | ICD-10-CM

## 2021-11-21 ENCOUNTER — Ambulatory Visit: Payer: BC Managed Care – PPO | Admitting: Allergy

## 2021-11-21 DIAGNOSIS — F209 Schizophrenia, unspecified: Secondary | ICD-10-CM | POA: Diagnosis not present

## 2021-11-24 DIAGNOSIS — Z23 Encounter for immunization: Secondary | ICD-10-CM | POA: Diagnosis not present

## 2021-12-04 ENCOUNTER — Ambulatory Visit (INDEPENDENT_AMBULATORY_CARE_PROVIDER_SITE_OTHER): Payer: BC Managed Care – PPO | Admitting: *Deleted

## 2021-12-04 DIAGNOSIS — J309 Allergic rhinitis, unspecified: Secondary | ICD-10-CM | POA: Diagnosis not present

## 2021-12-19 DIAGNOSIS — F209 Schizophrenia, unspecified: Secondary | ICD-10-CM | POA: Diagnosis not present

## 2021-12-22 DIAGNOSIS — M85851 Other specified disorders of bone density and structure, right thigh: Secondary | ICD-10-CM | POA: Diagnosis not present

## 2021-12-22 DIAGNOSIS — Z1231 Encounter for screening mammogram for malignant neoplasm of breast: Secondary | ICD-10-CM | POA: Diagnosis not present

## 2022-01-01 ENCOUNTER — Ambulatory Visit (INDEPENDENT_AMBULATORY_CARE_PROVIDER_SITE_OTHER): Payer: BC Managed Care – PPO | Admitting: *Deleted

## 2022-01-01 DIAGNOSIS — J309 Allergic rhinitis, unspecified: Secondary | ICD-10-CM

## 2022-01-08 ENCOUNTER — Telehealth: Payer: Self-pay | Admitting: Allergy

## 2022-01-08 NOTE — Telephone Encounter (Signed)
Patient called just wanting to inform Dr. Delorse Lek that she had an allergy flare up last week (Friday) and took generic Allegra and it helped with all her symptoms. She is feeling a lot better now.

## 2022-01-16 DIAGNOSIS — F209 Schizophrenia, unspecified: Secondary | ICD-10-CM | POA: Diagnosis not present

## 2022-01-29 ENCOUNTER — Ambulatory Visit (INDEPENDENT_AMBULATORY_CARE_PROVIDER_SITE_OTHER): Payer: BC Managed Care – PPO | Admitting: *Deleted

## 2022-01-29 DIAGNOSIS — J309 Allergic rhinitis, unspecified: Secondary | ICD-10-CM

## 2022-02-01 ENCOUNTER — Ambulatory Visit: Payer: BC Managed Care – PPO | Attending: Cardiology | Admitting: Cardiology

## 2022-02-01 ENCOUNTER — Encounter: Payer: Self-pay | Admitting: Cardiology

## 2022-02-01 VITALS — BP 138/74 | HR 75 | Ht 65.0 in | Wt 247.6 lb

## 2022-02-01 DIAGNOSIS — I10 Essential (primary) hypertension: Secondary | ICD-10-CM

## 2022-02-01 DIAGNOSIS — G4730 Sleep apnea, unspecified: Secondary | ICD-10-CM

## 2022-02-01 DIAGNOSIS — E782 Mixed hyperlipidemia: Secondary | ICD-10-CM | POA: Diagnosis not present

## 2022-02-01 HISTORY — DX: Morbid (severe) obesity due to excess calories: E66.01

## 2022-02-01 NOTE — Progress Notes (Signed)
Cardiology Office Note:    Date:  02/01/2022   ID:  Gloria Dalton, DOB [birth year], MRN [id number]  PCP:  Alinda Deem, MD  Cardiologist:  Garwin Brothers, MD   Referring MD: Alinda Deem, MD    ASSESSMENT:    1. Essential hypertension   2. Sleep apnea, unspecified type   3. Mixed hyperlipidemia   4. Morbid obesity (HCC)    PLAN:    In order of problems listed above:  Primary prevention stressed with the patient.  Importance of compliance with diet medication stressed and she vocalized understanding.  She was advised to walk at least half regularly 5 days a week and she promises to do so. Essential hypertension: Blood pressure stable and diet was emphasized.  Lifestyle modification urged. Mixed dyslipidemia: On lipid-lowering medications followed by primary care.  She is due for blood work in the next couple of weeks and I told her to send her a copy of blood work done by primary care.  Diet emphasized.  Lifestyle modification urged she promises to do better. Morbid obesity: Weight reduction stressed risks of obesity explained and she plans to do better. Patient will be seen in follow-up appointment in 12 months or earlier if the patient has any concerns    Medication Adjustments/Labs and Tests Ordered: Current medicines are reviewed at length with the patient today.  Concerns regarding medicines are outlined above.  No orders of the defined types were placed in this encounter.  No orders of the defined types were placed in this encounter.    No chief complaint on file.    History of Present Illness:    Gloria Dalton is a 51 y.o. female.  Patient has past medical history of essential hypertension, mixed dyslipidemia and morbid obesity.  She leads a sedentary lifestyle.  She denies any chest pain orthopnea or PND.  At the time of my evaluation, the patient is alert awake oriented and in no distress.  Past Medical History:  Diagnosis Date   Allergic rhinitis     Allergies    BMI 40.0-44.9, adult (HCC)    Cardiac murmur    Essential hypertension    High blood pressure    High cholesterol    Hyperglycemia, unspecified    Hyperlipidemia    Hypertension    Localized edema    Obesity, morbid (HCC)    Sleep apnea     Past Surgical History:  Procedure Laterality Date   BREAST LUMPECTOMY     PARTIAL HYSTERECTOMY     age 27   TONSILLECTOMY     UTERINE FIBROID SURGERY      Current Medications: Current Meds  Medication Sig   CALCIUM PO Take 1 tablet by mouth 2 (two) times daily.   Cholecalciferol (D3-1000 PO) Take 1,000 Units by mouth daily.   EPINEPHrine 0.3 mg/0.3 mL IJ SOAJ injection Use as directed for life threatening allergic reactions   fexofenadine (ALLEGRA) 180 MG tablet Take 180 mg by mouth daily as needed for allergies or rhinitis.   haloperidol decanoate (HALDOL DECANOATE) 50 MG/ML injection Inject 50 mg into the muscle every 30 (thirty) days.   rosuvastatin (CRESTOR) 20 MG tablet Take 20 mg by mouth at bedtime.   valsartan-hydrochlorothiazide (DIOVAN-HCT) 320-25 MG tablet Take 1 tablet by mouth daily.     Allergies:   Aspirin, Benicar [olmesartan], Fluticasone propionate, Nasonex [mometasone], and Penicillins   Social History   Socioeconomic History   Marital status: Single    Spouse name: Not  on file   Number of children: Not on file   Years of education: Not on file   Highest education level: Not on file  Occupational History   Not on file  Tobacco Use   Smoking status: Never    Passive exposure: Never   Smokeless tobacco: Never  Vaping Use   Vaping Use: Never used  Substance and Sexual Activity   Alcohol use: Never   Drug use: Never   Sexual activity: Not on file  Other Topics Concern   Not on file  Social History Narrative   Not on file   Social Determinants of Health   Financial Resource Strain: Not on file  Food Insecurity: Not on file  Transportation Needs: Not on file  Physical Activity: Not on  file  Stress: Not on file  Social Connections: Not on file     Family History: The patient's family history includes Breast cancer in her maternal aunt, maternal aunt, and mother; Heart attack in her father and maternal grandfather; High blood pressure in her father and paternal grandfather; Stroke in her maternal grandmother.  ROS:   Please see the history of present illness.    All other systems reviewed and are negative.  EKGs/Labs/Other Studies Reviewed:    The following studies were reviewed today: EKG reveals sinus and nonspecific ST-T changes   Recent Labs: No results found for requested labs within last 365 days.  Recent Lipid Panel No results found for: "CHOL", "TRIG", "HDL", "CHOLHDL", "VLDL", "LDLCALC", "LDLDIRECT"  Physical Exam:    VS:  BP 138/74   Pulse 75   Ht 5\' 5"  (1.651 m)   Wt 247 lb 9.6 oz (112.3 kg)   SpO2 99%   BMI 41.20 kg/m     Wt Readings from Last 3 Encounters:  02/01/22 247 lb 9.6 oz (112.3 kg)  05/23/21 251 lb 6.4 oz (114 kg)  01/20/21 247 lb 3.2 oz (112.1 kg)     GEN: Patient is in no acute distress HEENT: Normal NECK: No JVD; No carotid bruits LYMPHATICS: No lymphadenopathy CARDIAC: Hear sounds regular, 2/6 systolic murmur at the apex. RESPIRATORY:  Clear to auscultation without rales, wheezing or rhonchi  ABDOMEN: Soft, non-tender, non-distended MUSCULOSKELETAL:  No edema; No deformity  SKIN: Warm and dry NEUROLOGIC:  Alert and oriented x 3 PSYCHIATRIC:  Normal affect   Signed, 14/02/22, MD  02/01/2022 9:22 AM    [facility]

## 2022-02-01 NOTE — Patient Instructions (Signed)
Medication Instructions:  Your physician recommends that you continue on your current medications as directed. Please refer to the Current Medication list given to you today.  *If you need a refill on your cardiac medications before your next appointment, please call your pharmacy*   Lab Work: None ordered If you have labs (blood work) drawn today and your tests are completely normal, you will receive your results only by: MyChart Message (if you have MyChart) OR A paper copy in the mail If you have any lab test that is abnormal or we need to change your treatment, we will call you to review the results.   Testing/Procedures: None ordered   Follow-Up: At [facility], you and your health needs are our priority.  As part of our continuing mission to provide you with exceptional heart care, we have created designated Provider Care Teams.  These Care Teams include your primary Cardiologist (physician) and Advanced Practice Providers (APPs -  Physician Assistants and Nurse Practitioners) who all work together to provide you with the care you need, when you need it.  We recommend signing up for the patient portal called "MyChart".  Sign up information is provided on this After Visit Summary.  MyChart is used to connect with patients for Virtual Visits (Telemedicine).  Patients are able to view lab/test results, encounter notes, upcoming appointments, etc.  Non-urgent messages can be sent to your provider as well.   To learn more about what you can do with MyChart, go to [email or web address].    Your next appointment:   12 month(s)  The format for your next appointment:   In Person  Provider:   Rajan Revankar, MD    Other Instructions none  Important Information About Sugar      

## 2022-02-14 DIAGNOSIS — F209 Schizophrenia, unspecified: Secondary | ICD-10-CM | POA: Diagnosis not present

## 2022-02-15 DIAGNOSIS — F209 Schizophrenia, unspecified: Secondary | ICD-10-CM | POA: Diagnosis not present

## 2022-02-15 DIAGNOSIS — I10 Essential (primary) hypertension: Secondary | ICD-10-CM | POA: Diagnosis not present

## 2022-02-15 DIAGNOSIS — E7800 Pure hypercholesterolemia, unspecified: Secondary | ICD-10-CM | POA: Diagnosis not present

## 2022-02-15 DIAGNOSIS — G4733 Obstructive sleep apnea (adult) (pediatric): Secondary | ICD-10-CM | POA: Diagnosis not present

## 2022-02-16 LAB — LAB REPORT - SCANNED: eGFR: 93

## 2022-02-18 ENCOUNTER — Encounter: Payer: Self-pay | Admitting: Allergy

## 2022-02-20 NOTE — Telephone Encounter (Signed)
I notice that she has problems with Nasonex. Has she tried budesonide nasal spray? 1 spray in each nostril daily.This could help with nasal congestion. She could also try over the counter Astelin (azelastine) 1-2 sprays in each nostril one to two times a day as needed for drainage down the throat.   She could also use saline rinse,but use this prior to any medicated nasal sprays

## 2022-02-20 NOTE — Telephone Encounter (Signed)
Informed of message. She will purchase some OTC nasal sprays to try.

## 2022-02-22 NOTE — Telephone Encounter (Signed)
Patient called stating she purchased Astepro but it made her symptoms worse. Her cough got worse and she had more drainage so she stopped using it. She is feeling better today than previous days.

## 2022-02-22 NOTE — Telephone Encounter (Signed)
Glad she is feeling better. Let us know if it continues to be a problem.

## 2022-03-01 ENCOUNTER — Ambulatory Visit (INDEPENDENT_AMBULATORY_CARE_PROVIDER_SITE_OTHER): Payer: BC Managed Care – PPO

## 2022-03-01 DIAGNOSIS — Z6841 Body Mass Index (BMI) 40.0 and over, adult: Secondary | ICD-10-CM | POA: Diagnosis not present

## 2022-03-01 DIAGNOSIS — Z90710 Acquired absence of both cervix and uterus: Secondary | ICD-10-CM | POA: Diagnosis not present

## 2022-03-01 DIAGNOSIS — J309 Allergic rhinitis, unspecified: Secondary | ICD-10-CM

## 2022-03-01 DIAGNOSIS — Z0000 Encounter for general adult medical examination without abnormal findings: Secondary | ICD-10-CM | POA: Diagnosis not present

## 2022-03-06 NOTE — Progress Notes (Signed)
VIALS EXP 03-07-23 

## 2022-03-07 DIAGNOSIS — J3089 Other allergic rhinitis: Secondary | ICD-10-CM

## 2022-03-13 DIAGNOSIS — F209 Schizophrenia, unspecified: Secondary | ICD-10-CM | POA: Diagnosis not present

## 2022-03-15 ENCOUNTER — Telehealth: Payer: Self-pay | Admitting: Allergy

## 2022-03-15 NOTE — Telephone Encounter (Signed)
Patient wanted to let Dr. Nelva Bush know that she will not be taking Claritin as she feels that she does not need it anymore.

## 2022-03-27 ENCOUNTER — Telehealth: Payer: Self-pay | Admitting: *Deleted

## 2022-03-27 ENCOUNTER — Encounter: Payer: Self-pay | Admitting: *Deleted

## 2022-03-27 NOTE — Telephone Encounter (Signed)
Gloria Dalton has been coming in for her ITX every 4 weeks since September 2023. She states that her symptoms have been worse lately and she wants to go back to an every 2 week schedule for her injections. I told her that would be fine. She wants to know exactly when her symptoms will get better after she resumes her 2 week schedule. I tried to explain that we cannot give her an exact timeline but she states that we should be able to give her an answer. Please advise.

## 2022-03-27 NOTE — Telephone Encounter (Signed)
I have sent this message to [facility] through [place].

## 2022-03-29 ENCOUNTER — Ambulatory Visit (INDEPENDENT_AMBULATORY_CARE_PROVIDER_SITE_OTHER): Payer: BC Managed Care – PPO

## 2022-03-29 DIAGNOSIS — J309 Allergic rhinitis, unspecified: Secondary | ICD-10-CM

## 2022-04-10 DIAGNOSIS — F209 Schizophrenia, unspecified: Secondary | ICD-10-CM | POA: Diagnosis not present

## 2022-04-12 ENCOUNTER — Ambulatory Visit (INDEPENDENT_AMBULATORY_CARE_PROVIDER_SITE_OTHER): Payer: BC Managed Care – PPO | Admitting: *Deleted

## 2022-04-12 ENCOUNTER — Ambulatory Visit: Payer: BC Managed Care – PPO | Attending: Cardiovascular Disease | Admitting: Cardiovascular Disease

## 2022-04-12 ENCOUNTER — Encounter: Payer: Self-pay | Admitting: Cardiovascular Disease

## 2022-04-12 VITALS — BP 102/66 | HR 72 | Ht 65.0 in | Wt 246.8 lb

## 2022-04-12 DIAGNOSIS — G4733 Obstructive sleep apnea (adult) (pediatric): Secondary | ICD-10-CM

## 2022-04-12 DIAGNOSIS — J309 Allergic rhinitis, unspecified: Secondary | ICD-10-CM

## 2022-04-12 DIAGNOSIS — E782 Mixed hyperlipidemia: Secondary | ICD-10-CM | POA: Diagnosis not present

## 2022-04-12 DIAGNOSIS — I10 Essential (primary) hypertension: Secondary | ICD-10-CM | POA: Diagnosis not present

## 2022-04-12 DIAGNOSIS — G4730 Sleep apnea, unspecified: Secondary | ICD-10-CM

## 2022-04-12 NOTE — Progress Notes (Signed)
Cardiology Office Note    Date:  04/16/2022   ID:  Gloria Dalton, DOB 28-Jun-1970, MRN [id number]  PCP:  Gloria Right, MD  Cardiologist:  Gloria Majestic, MD   New sleep consultation referred by Dr. Greig Dalton at [place] for sleep apnea   History of Present Illness:  Gloria Dalton is a 52 y.o. female who is referred through the courtesy of Dr. Burnett Dalton for evaluation of sleep apnea.  Ms. Gloria Dalton has a history of hypertension, hyperlipidemia, as well as morbid obesity.  Apparently, on September 30, 2019 she was referred to [facility] sleep center in [facility] and underwent a sleep study due to concerns for obstructive sleep apnea.  I was finally able to get a copy of this report which revealed severe sleep apnea with an AHI of 56.8/h.  Moderate oxygen desaturation to a nadir of 80%.  She apparently received a ResMed air sense 10 AutoSet unit on March 15, 2020.  She has never really had follow-up with reference to her sleep apnea consider study and her initial set up was a pressure range of 4 to 20 cm of water.  She does not understand is actually what sleep apnea is.  Prior to initiating therapy she had symptoms of snoring, frequent awakenings, would awaken gasping for breath, and had witnessed apnea.  [employer] is her DME company.  She was recently evaluated by Dr. Burnett Dalton at [place] and is now referred to me for sleep consultation and evaluation.  She has a history of bipolar disorder and is on haloperidol.  For hypertension she has been on valsartan HCT 320/25 mg.  She is on rosuvastatin 20 mg for hyperlipidemia.  She takes Allegra as needed for allergies.  She takes allergy shots every other week.  Since initiating CPAP therapy, she is unaware of breakthrough snoring.  Her sleep is more restorative.  She denies any significant nocturia.  An Epworth Sleepiness Scale score was recalculated in the office today and this endorsed at 0 argue  against daytime sleepiness.   Past Medical History:  Diagnosis Date   Allergic rhinitis    Allergies    BMI 40.0-44.9, adult (HCC)    Cardiac murmur    Essential hypertension    High blood pressure    High cholesterol    Hyperglycemia, unspecified    Hyperlipidemia    Hypertension    Localized edema    Obesity, morbid ([place])    Sleep apnea     Past Surgical History:  Procedure Laterality Date   BREAST LUMPECTOMY     PARTIAL HYSTERECTOMY     age 41   TONSILLECTOMY     UTERINE FIBROID SURGERY      Current Medications: Outpatient Medications Prior to Visit  Medication Sig Dispense Refill   CALCIUM PO Take 1 tablet by mouth 2 (two) times daily.     Cholecalciferol (D3-1000 PO) Take 1,000 Units by mouth daily.     haloperidol decanoate (HALDOL DECANOATE) 50 MG/ML injection Inject 50 mg into the muscle every 30 (thirty) days.     rosuvastatin (CRESTOR) 20 MG tablet Take 20 mg by mouth at bedtime.     valsartan-hydrochlorothiazide (DIOVAN-HCT) 320-25 MG tablet Take 1 tablet by mouth daily.     EPINEPHrine 0.3 mg/0.3 mL IJ SOAJ injection Use as directed for life threatening allergic reactions (Patient not taking: Reported on 04/12/2022) 2 each 2   fexofenadine (ALLEGRA) 180 MG tablet Take 180  mg by mouth daily as needed for allergies or rhinitis. (Patient not taking: Reported on 04/12/2022)     haloperidol (HALDOL) 2 MG tablet Take 2 mg by mouth at bedtime. (Patient not taking: Reported on 04/12/2022)     No facility-administered medications prior to visit.     Allergies:   Aspirin, Benicar [olmesartan], Fluticasone propionate, Nasonex [mometasone], and Penicillins   Social History   Socioeconomic History   Marital status: Single    Spouse name: Not on file   Number of children: Not on file   Years of education: Not on file   Highest education level: Not on file  Occupational History   Not on file  Tobacco Use   Smoking status: Never    Passive exposure: Never    Smokeless tobacco: Never  Vaping Use   Vaping Use: Never used  Substance and Sexual Activity   Alcohol use: Never   Drug use: Never   Sexual activity: Not on file  Other Topics Concern   Not on file  Social History Narrative   Not on file   Social Determinants of Health   Financial Resource Strain: Not on file  Food Insecurity: Not on file  Transportation Needs: Not on file  Physical Activity: Not on file  Stress: Not on file  Social Connections: Not on file    Social history is notable that she was born in [place].  He is single with no children.  She has a sister age 26.  She works with disabled residents.  There is no tobacco or alcohol history.  She does not routinely exercise.  Family History:  The patient's family history includes Breast cancer in her maternal aunt, maternal aunt, and mother; Heart attack in her father and maternal grandfather; High blood pressure in her father and paternal grandfather; Stroke in her maternal grandmother.  Her mother is living and has breast cancer.  Father died at age 40 with a heart attack.  Her sister is 32.  ROS General: Negative; No fevers, chills, or night sweats;  HEENT: Negative; No changes in vision or hearing, sinus congestion, difficulty swallowing Pulmonary: Negative; No cough, wheezing, shortness of breath, hemoptysis Cardiovascular: Negative; No chest pain, presyncope, syncope, palpitations GI: Negative; No nausea, vomiting, diarrhea, or abdominal pain GU: Negative; No dysuria, hematuria, or difficulty voiding Musculoskeletal: Negative; no myalgias, joint pain, or weakness Hematologic/Oncology: Negative; no easy bruising, bleeding Endocrine: Negative; no heat/cold intolerance; no diabetes Neuro: Negative; no changes in balance, headaches Skin: Negative; No rashes or skin lesions Psychiatric: Negative; No behavioral problems, depression Sleep: See HPI Other comprehensive 14 point system review is negative.   PHYSICAL  EXAM:   VS:  BP 102/66 (BP Location: Left Arm, Patient Position: Sitting, Cuff Size: Large)   Pulse 72   Ht '5\' 5"'$  (1.651 m)   Wt 246 lb 12.8 oz (111.9 kg)   SpO2 100%   BMI 41.07 kg/m     Repeat blood pressure by me was 116/72  Wt Readings from Last 3 Encounters:  04/12/22 246 lb 12.8 oz (111.9 kg)  02/01/22 247 lb 9.6 oz (112.3 kg)  05/23/21 251 lb 6.4 oz (114 kg)    General: Alert, oriented, no distress.  Morbid obesity Skin: normal turgor, no rashes, warm and dry HEENT: Normocephalic, atraumatic. Pupils equal round and reactive to light; sclera anicteric; extraocular muscles intact;  Nose without nasal septal hypertrophy Mouth/Parynx benign; Mallinpatti scale 4 Neck: Thick neck; no JVD, no carotid bruits; normal carotid upstroke Lungs:  clear to ausculatation and percussion; no wheezing or rales Chest wall: without tenderness to palpitation Heart: PMI not displaced, RRR, s1 s2 normal, 1/6 systolic murmur, no diastolic murmur, no rubs, gallops, thrills, or heaves Abdomen: Central adiposity; soft, nontender; no hepatosplenomehaly, BS+; abdominal aorta nontender and not dilated by palpation. Back: no CVA tenderness Pulses 2+ Musculoskeletal: full range of motion, normal strength, no joint deformities Extremities: no clubbing cyanosis or edema, Homan's sign negative  Neurologic: grossly nonfocal; Cranial nerves grossly wnl Psychologic: Normal mood and affect   Studies/Labs Reviewed:   April 12, 2022 ECG (independently read by me): NSR at 71, mild sinus arrythmia  Recent Labs:     No data to display               No data to display              No data to display         No results found for: "MCV" No results found for: "TSH" No results found for: "HGBA1C"   BNP No results found for: "BNP"  ProBNP No results found for: "PROBNP"   Lipid Panel  No results found for: "CHOL", "TRIG", "HDL", "CHOLHDL", "VLDL", "LDLCALC", "LDLDIRECT",  "LABVLDL"   RADIOLOGY: No results found.   Additional studies/ records that were reviewed today include:   Reviewed the records from [place] sleep center and specifically the sleep study of September 30, 2019.  Records of Gloria Dalton at [facility] family medicine were reviewed.   ASSESSMENT:    1. Obstructive sleep apnea syndrome on CPAP   2. Essential hypertension   3. Morbid obesity ([place])   4. Mixed hyperlipidemia     PLAN:  Gloria Dalton is a very pleasant 52 year old female who has a history of hypertension currently needed with valsartan HCT 320/25 mg.  She is on Haldol for bipolar disorder.  She has a history of hyperlipidemia on rosuvastatin 20 mg daily.  Due to concerns for obstructive sleep apnea with symptoms of snoring, awakening gasping for breath, witnessed apnea, nonrestorative sleep, she was referred for a sleep study which was done in [place] at the [facility] sleep center on September 30, 2019.  I will finally was able to obtain records of this report.  She was found to have severe sleep apnea with an AHI of 56.8/h.  O2 nadir was 80%.  She apparently was started on AutoPap therapy with a pressure range of 4 to 20 cm of water.  She never had any follow-up sleep evaluation.  She is now referred by Dr. Burnett Dalton.  In the office today I reviewed her sleep study with her in detail.  I discussed the severity of her sleep apnea. I had a long discussion with her today in the office and discussed normal sleep architecture and the disruptive sleep architecture associated with obstructive sleep apnea.  I reviewed in detail the effect on cardiovascular health if patients have untreated sleep apnea with particular reference to its effects on hypertension, nocturnal arrhythmias with increased sympathetic tone contributing to palpitations as well as increased atrial fibrillation risk.  I discussed its effects on negative effects on insulin resistance contributing to increased glucose,  increased development of nocturnal GERD, as well as potential increased inflammation.  In addition we discussed nocturnal hypoxemia which potentially can contribute to nocturnal ischemia both of the cardiac and cerebrovascular circulation if atherosclerosis is present.   I also discussed the pathophysiology associated with frequent nocturia seen in patients with untreated sleep apnea  and the benefit of therapy.  I discussed optimal sleep duration at 7 and 9 hours.  I reviewed her most recent download.  She is meeting compliance with excellent use is averaging 7 hours and 36 minutes of CPAP use per night.  She does not have any significant mask leak.  On her most recent download, her 95th percentile pressure is 17.4 with maximum average pressure of 17.7.  Her CPAP has been starting at a low initial pressure of only 4 cm.  As result, based on her download I will change her parameters.  I will increase her ramp start pressure from 4-6.  I will change her CPAP pressure range to start at 10 cm of water up to 20 cm of water.  I discussed the importance of weight loss and increased exercise.  After my lengthy discussion, she now had significantly improved understanding of his sleep apnea and the importance of optimal therapy.  She was very appreciative of the time I spent with her in the office today for education purposes.  Blood pressure today is stable on current therapy.  I answered all her questions.  As long as she is stable, I will see her in 1 year for follow-up evaluation or sooner as needed.   Medication Adjustments/Labs and Tests Ordered: Current medicines are reviewed at length with the patient today.  Concerns regarding medicines are outlined above.  Medication changes, Labs and Tests ordered today are listed in the Patient Instructions below. Patient Instructions  Medication Instructions:  No changes     Lab Work:  Not needed.   Testing/Procedures:  Not  needed  Follow-Up: At [facility], you and your health needs are our priority.  As part of our continuing mission to provide you with exceptional heart care, we have created designated Provider Care Teams.  These Care Teams include your primary Cardiologist (physician) and Advanced Practice Providers (APPs -  Physician Assistants and Nurse Practitioners) who all work together to provide you with the care you need, when you need it.     Your next appointment:   12 month(s) sleep clinic only   The format for your next appointment:   In Person  Provider:   DR Gloria Dalton     Other Instructions   pressure changes were done to your C-PAP    Signed, Gloria Majestic, MD, [facility], ABSM Diplomate, American Board of Sleep Medicine  04/16/2022 5:06 PM    [facility] [address], [place], [place], [place]  [address] Phone: [phone]

## 2022-04-12 NOTE — Patient Instructions (Signed)
Medication Instructions:  No changes     Lab Work:  Not needed.   Testing/Procedures:  Not  needed  Follow-Up: At [facility], you and your health needs are our priority.  As part of our continuing mission to provide you with exceptional heart care, we have created designated Provider Care Teams.  These Care Teams include your primary Cardiologist (physician) and Advanced Practice Providers (APPs -  Physician Assistants and Nurse Practitioners) who all work together to provide you with the care you need, when you need it.     Your next appointment:   12 month(s) sleep clinic only   The format for your next appointment:   In Person  Provider:   DR Shelva Majestic     Other Instructions   pressure changes were done to your C-PAP

## 2022-04-16 ENCOUNTER — Encounter: Payer: Self-pay | Admitting: Cardiovascular Disease

## 2022-04-26 ENCOUNTER — Ambulatory Visit (INDEPENDENT_AMBULATORY_CARE_PROVIDER_SITE_OTHER): Payer: BC Managed Care – PPO | Admitting: *Deleted

## 2022-04-26 DIAGNOSIS — J309 Allergic rhinitis, unspecified: Secondary | ICD-10-CM | POA: Diagnosis not present

## 2022-04-30 DIAGNOSIS — G4733 Obstructive sleep apnea (adult) (pediatric): Secondary | ICD-10-CM | POA: Diagnosis not present

## 2022-05-08 DIAGNOSIS — F209 Schizophrenia, unspecified: Secondary | ICD-10-CM | POA: Diagnosis not present

## 2022-05-10 ENCOUNTER — Ambulatory Visit (INDEPENDENT_AMBULATORY_CARE_PROVIDER_SITE_OTHER): Payer: BC Managed Care – PPO

## 2022-05-10 DIAGNOSIS — J309 Allergic rhinitis, unspecified: Secondary | ICD-10-CM | POA: Diagnosis not present

## 2022-05-21 ENCOUNTER — Telehealth: Payer: Self-pay | Admitting: Cardiology

## 2022-05-21 NOTE — Telephone Encounter (Signed)
Pt states her copay has always been $55. Her most recent appt with Dr. Claiborne Billings she was charge $40. She states she has been charged incorrectly and was told she would get her $15 back but she hasn't received. Please advise

## 2022-05-22 ENCOUNTER — Encounter: Payer: Self-pay | Admitting: Allergy

## 2022-05-22 ENCOUNTER — Ambulatory Visit: Payer: BC Managed Care – PPO | Admitting: Allergy

## 2022-05-22 VITALS — BP 118/72 | HR 97 | Resp 16 | Ht 65.0 in | Wt 237.6 lb

## 2022-05-22 DIAGNOSIS — H1013 Acute atopic conjunctivitis, bilateral: Secondary | ICD-10-CM

## 2022-05-22 DIAGNOSIS — J309 Allergic rhinitis, unspecified: Secondary | ICD-10-CM | POA: Diagnosis not present

## 2022-05-22 DIAGNOSIS — K219 Gastro-esophageal reflux disease without esophagitis: Secondary | ICD-10-CM

## 2022-05-22 DIAGNOSIS — J3089 Other allergic rhinitis: Secondary | ICD-10-CM

## 2022-05-22 NOTE — Patient Instructions (Addendum)
Allergic rhinitis with conjunctivitis  - continue avoidance measures for grass pollens, weed pollens, tree pollen, dust mite and mold  - continue allergen immunotherapy (allergy shots) per schedule.  You are in the full strength red vial!   During pollen season will maintain at twice a month dosing and will plan to try again monthly dosing during the non-pollen season.   - continue Allegra 180mg  daily as needed  - for itchy/watery/red eyes Pataday 1 drop each eye daily as needed is a good option to continue as needed use  Reflux  - currently not having any issues    - continue your lifestyle modifications including not eating before bedtime and avoiding foods that trigger reflux  With your routine labwork make sure you have a CBC with differential to look at your absolute count of eosinophils.   Follow-up in 12 months or sooner if needed

## 2022-05-22 NOTE — Progress Notes (Unsigned)
Follow-up Note  RE: Gloria Dalton MRN: [id number] DOB: June 13, 1970 Date of Office Visit: 05/22/2022   History of present illness: Gloria Dalton is a 52 y.o. female presenting today for follow-up of allergic with conjunctivitis and reflux.  She was last seen in the office on 05/23/2021 myself.  She states she has been doing well over the past year.  Does not report any major health changes, surgeries or hospitalizations. She states in the winter she did have a URI for which she was able to self recover from.   She is on immunotherapy and is at maintenance dosing but she does get her injections every 2 weeks at this time.  She does feel like she is seeing improvements with her allergy symptoms since being on immunotherapy.  She uses the Allegra as needed at this time.  She also use Pataday eyedrops when she does need to use them for itchy watery eyes.  She is tolerating immunotherapy without any large local or systemic reactions.  She does have access to an epinephrine device. She is not reporting any reflux symptoms.  She has employed ploys lifestyle modifications late-night eating after a certain hour and avoiding her trigger foods. She did have some lab work done earlier this year and it did flagged her eosinophils as being high.  She however is not having any signs or symptoms of concern for an elevated eosinophil count at this time.  She states her PCP will likely be doing her yearly lab work over the summer.  Review of systems in the past 4 weeks: Review of Systems  Constitutional: Negative.   HENT: Negative.    Eyes: Negative.   Respiratory: Negative.    Cardiovascular: Negative.   Gastrointestinal: Negative.   Musculoskeletal: Negative.   Skin: Negative.   Allergic/Immunologic: Negative.   Neurological: Negative.      All other systems negative unless noted above in HPI  Past medical/social/surgical/family history have been reviewed and are unchanged unless specifically  indicated below.  No changes  Medication List: Current Outpatient Medications  Medication Sig Dispense Refill   CALCIUM PO Take 1 tablet by mouth 2 (two) times daily.     Cholecalciferol (D3-1000 PO) Take 1,000 Units by mouth daily.     EPINEPHrine 0.3 mg/0.3 mL IJ SOAJ injection Use as directed for life threatening allergic reactions 2 each 2   fexofenadine (ALLEGRA) 180 MG tablet Take 180 mg by mouth daily as needed for allergies or rhinitis.     haloperidol (HALDOL) 2 MG tablet Take 2 mg by mouth at bedtime.     haloperidol decanoate (HALDOL DECANOATE) 50 MG/ML injection Inject 50 mg into the muscle every 30 (thirty) days.     rosuvastatin (CRESTOR) 20 MG tablet Take 20 mg by mouth at bedtime.     valsartan-hydrochlorothiazide (DIOVAN-HCT) 320-25 MG tablet Take 1 tablet by mouth daily.     No current facility-administered medications for this visit.     Known medication allergies: Allergies  Allergen Reactions   Aspirin Cough   Benicar [Olmesartan] Other (See Comments)    Abdominal pain/severe   Fluticasone Propionate    Nasonex [Mometasone] Cough   Penicillins Other (See Comments)    Patient unsure; gas/bloating     Physical examination: Blood pressure 118/72, pulse 97, resp. rate 16, height 5\' 5"  (1.651 m), weight 237 lb 9.6 oz (107.8 kg), SpO2 96 %.  General: Alert, interactive, in no acute distress. HEENT: PERRLA, TMs pearly gray, turbinates Clear to auscultation  without wheezing, rhonchi or rales without discharge, post-pharynx non erythematous. Neck: Supple without lymphadenopathy. Lungs: Clear to auscultation without wheezing, rhonchi or rales. {no increased work of breathing. CV: Normal S1, S2 without murmurs. Abdomen: Nondistended, nontender. Skin: Warm and dry, without lesions or rashes. Extremities:  No clubbing, cyanosis or edema. Neuro:   Grossly intact.  Diagnositics/Labs: None today  Assessment and plan:  Allergic rhinitis with conjunctivitis  -  continue avoidance measures for grass pollens, weed pollens, tree pollen, dust mite and mold  - continue allergen immunotherapy (allergy shots) per schedule.  You are in the full strength red vial!   During pollen season will maintain at twice a month dosing and will plan to try again monthly dosing during the non-pollen season.   - continue Allegra 180mg  daily as needed  - for itchy/watery/red eyes Pataday 1 drop each eye daily as needed is a good option to continue as needed use  Reflux  - currently not having any issues    - continue your lifestyle modifications including not eating before bedtime and avoiding foods that trigger reflux  With your routine labwork make sure you have a CBC with differential to look at your absolute count of eosinophils.   Follow-up in 12 months or sooner if needed  I appreciate the opportunity to take part in Patrcia's care. Please do not hesitate to contact me with questions.  Sincerely,   Prudy Feeler, MD Allergy/Immunology [facility]

## 2022-05-22 NOTE — Telephone Encounter (Signed)
This information would best come back from one of you in billing, just in case the pt has any additional f/u questions I am unable to answer. Thank you

## 2022-06-05 DIAGNOSIS — F209 Schizophrenia, unspecified: Secondary | ICD-10-CM | POA: Diagnosis not present

## 2022-06-07 ENCOUNTER — Ambulatory Visit (INDEPENDENT_AMBULATORY_CARE_PROVIDER_SITE_OTHER): Payer: BC Managed Care – PPO | Admitting: *Deleted

## 2022-06-07 DIAGNOSIS — J309 Allergic rhinitis, unspecified: Secondary | ICD-10-CM

## 2022-06-21 ENCOUNTER — Ambulatory Visit (INDEPENDENT_AMBULATORY_CARE_PROVIDER_SITE_OTHER): Payer: BC Managed Care – PPO | Admitting: *Deleted

## 2022-06-21 DIAGNOSIS — J309 Allergic rhinitis, unspecified: Secondary | ICD-10-CM | POA: Diagnosis not present

## 2022-07-03 DIAGNOSIS — F209 Schizophrenia, unspecified: Secondary | ICD-10-CM | POA: Diagnosis not present

## 2022-07-05 ENCOUNTER — Ambulatory Visit (INDEPENDENT_AMBULATORY_CARE_PROVIDER_SITE_OTHER): Payer: BC Managed Care – PPO | Admitting: *Deleted

## 2022-07-05 DIAGNOSIS — J309 Allergic rhinitis, unspecified: Secondary | ICD-10-CM | POA: Diagnosis not present

## 2022-07-31 DIAGNOSIS — F209 Schizophrenia, unspecified: Secondary | ICD-10-CM | POA: Diagnosis not present

## 2022-08-01 DIAGNOSIS — L810 Postinflammatory hyperpigmentation: Secondary | ICD-10-CM | POA: Diagnosis not present

## 2022-08-01 DIAGNOSIS — L304 Erythema intertrigo: Secondary | ICD-10-CM | POA: Diagnosis not present

## 2022-08-01 DIAGNOSIS — L209 Atopic dermatitis, unspecified: Secondary | ICD-10-CM | POA: Diagnosis not present

## 2022-08-01 DIAGNOSIS — D2239 Melanocytic nevi of other parts of face: Secondary | ICD-10-CM | POA: Diagnosis not present

## 2022-08-02 ENCOUNTER — Ambulatory Visit (INDEPENDENT_AMBULATORY_CARE_PROVIDER_SITE_OTHER): Payer: BC Managed Care – PPO | Admitting: *Deleted

## 2022-08-02 DIAGNOSIS — J309 Allergic rhinitis, unspecified: Secondary | ICD-10-CM | POA: Diagnosis not present

## 2022-08-16 DIAGNOSIS — G4733 Obstructive sleep apnea (adult) (pediatric): Secondary | ICD-10-CM | POA: Diagnosis not present

## 2022-08-16 DIAGNOSIS — E7800 Pure hypercholesterolemia, unspecified: Secondary | ICD-10-CM | POA: Diagnosis not present

## 2022-08-16 DIAGNOSIS — I10 Essential (primary) hypertension: Secondary | ICD-10-CM | POA: Diagnosis not present

## 2022-08-16 DIAGNOSIS — Z79899 Other long term (current) drug therapy: Secondary | ICD-10-CM | POA: Diagnosis not present

## 2022-08-16 DIAGNOSIS — F209 Schizophrenia, unspecified: Secondary | ICD-10-CM | POA: Diagnosis not present

## 2022-08-28 DIAGNOSIS — F209 Schizophrenia, unspecified: Secondary | ICD-10-CM | POA: Diagnosis not present

## 2022-08-30 ENCOUNTER — Telehealth: Payer: Self-pay | Admitting: Allergy

## 2022-08-30 ENCOUNTER — Ambulatory Visit (INDEPENDENT_AMBULATORY_CARE_PROVIDER_SITE_OTHER): Payer: BC Managed Care – PPO | Admitting: *Deleted

## 2022-08-30 DIAGNOSIS — J309 Allergic rhinitis, unspecified: Secondary | ICD-10-CM | POA: Diagnosis not present

## 2022-08-30 MED ORDER — EPINEPHRINE 0.3 MG/0.3ML IJ SOAJ: INTRAMUSCULAR | 2 refills | Status: DC

## 2022-08-30 NOTE — Telephone Encounter (Signed)
Patient needs a refill on her EpiPen sent to [facility] in [place].

## 2022-09-03 ENCOUNTER — Other Ambulatory Visit: Payer: Self-pay | Admitting: *Deleted

## 2022-09-03 MED ORDER — EPINEPHRINE 0.3 MG/0.3ML IJ SOAJ: INTRAMUSCULAR | 2 refills | Status: AC

## 2022-09-11 DIAGNOSIS — F209 Schizophrenia, unspecified: Secondary | ICD-10-CM | POA: Diagnosis not present

## 2022-09-12 DIAGNOSIS — J3089 Other allergic rhinitis: Secondary | ICD-10-CM | POA: Diagnosis not present

## 2022-09-12 NOTE — Progress Notes (Signed)
VIALS EXP 09-12-23

## 2022-09-12 NOTE — Progress Notes (Signed)
Vials exp 09-12-23

## 2022-10-09 DIAGNOSIS — F209 Schizophrenia, unspecified: Secondary | ICD-10-CM | POA: Diagnosis not present

## 2022-10-11 ENCOUNTER — Ambulatory Visit (INDEPENDENT_AMBULATORY_CARE_PROVIDER_SITE_OTHER): Payer: BC Managed Care – PPO | Admitting: *Deleted

## 2022-10-11 DIAGNOSIS — J309 Allergic rhinitis, unspecified: Secondary | ICD-10-CM

## 2022-10-24 ENCOUNTER — Ambulatory Visit (INDEPENDENT_AMBULATORY_CARE_PROVIDER_SITE_OTHER): Payer: Self-pay | Admitting: *Deleted

## 2022-10-24 DIAGNOSIS — J309 Allergic rhinitis, unspecified: Secondary | ICD-10-CM | POA: Diagnosis not present

## 2022-11-06 DIAGNOSIS — F209 Schizophrenia, unspecified: Secondary | ICD-10-CM | POA: Diagnosis not present

## 2022-11-22 ENCOUNTER — Ambulatory Visit (INDEPENDENT_AMBULATORY_CARE_PROVIDER_SITE_OTHER): Payer: BC Managed Care – PPO

## 2022-11-22 DIAGNOSIS — J309 Allergic rhinitis, unspecified: Secondary | ICD-10-CM

## 2022-12-04 DIAGNOSIS — F209 Schizophrenia, unspecified: Secondary | ICD-10-CM | POA: Diagnosis not present

## 2023-01-01 DIAGNOSIS — F209 Schizophrenia, unspecified: Secondary | ICD-10-CM | POA: Diagnosis not present

## 2023-01-02 DIAGNOSIS — Z1231 Encounter for screening mammogram for malignant neoplasm of breast: Secondary | ICD-10-CM | POA: Diagnosis not present

## 2023-01-03 ENCOUNTER — Ambulatory Visit (INDEPENDENT_AMBULATORY_CARE_PROVIDER_SITE_OTHER): Payer: BC Managed Care – PPO | Admitting: *Deleted

## 2023-01-03 DIAGNOSIS — J309 Allergic rhinitis, unspecified: Secondary | ICD-10-CM

## 2023-01-21 ENCOUNTER — Ambulatory Visit (INDEPENDENT_AMBULATORY_CARE_PROVIDER_SITE_OTHER): Payer: Self-pay | Admitting: *Deleted

## 2023-01-21 DIAGNOSIS — J309 Allergic rhinitis, unspecified: Secondary | ICD-10-CM | POA: Diagnosis not present

## 2023-01-29 DIAGNOSIS — F209 Schizophrenia, unspecified: Secondary | ICD-10-CM | POA: Diagnosis not present

## 2023-02-04 ENCOUNTER — Ambulatory Visit (INDEPENDENT_AMBULATORY_CARE_PROVIDER_SITE_OTHER): Payer: BC Managed Care – PPO | Admitting: *Deleted

## 2023-02-04 DIAGNOSIS — J309 Allergic rhinitis, unspecified: Secondary | ICD-10-CM

## 2023-02-14 ENCOUNTER — Ambulatory Visit: Payer: BC Managed Care – PPO | Admitting: Cardiology

## 2023-02-15 DIAGNOSIS — G4733 Obstructive sleep apnea (adult) (pediatric): Secondary | ICD-10-CM | POA: Diagnosis not present

## 2023-02-26 DIAGNOSIS — F209 Schizophrenia, unspecified: Secondary | ICD-10-CM | POA: Diagnosis not present

## 2023-02-27 DIAGNOSIS — G4733 Obstructive sleep apnea (adult) (pediatric): Secondary | ICD-10-CM | POA: Diagnosis not present

## 2023-02-27 DIAGNOSIS — E7800 Pure hypercholesterolemia, unspecified: Secondary | ICD-10-CM | POA: Diagnosis not present

## 2023-02-27 DIAGNOSIS — F209 Schizophrenia, unspecified: Secondary | ICD-10-CM | POA: Diagnosis not present

## 2023-02-27 DIAGNOSIS — I10 Essential (primary) hypertension: Secondary | ICD-10-CM | POA: Diagnosis not present

## 2023-02-28 ENCOUNTER — Ambulatory Visit (INDEPENDENT_AMBULATORY_CARE_PROVIDER_SITE_OTHER): Payer: BC Managed Care – PPO | Admitting: *Deleted

## 2023-02-28 DIAGNOSIS — J309 Allergic rhinitis, unspecified: Secondary | ICD-10-CM | POA: Diagnosis not present

## 2023-02-28 LAB — LAB REPORT - SCANNED
Calcium: 9.8
EGFR: 93

## 2023-03-01 ENCOUNTER — Ambulatory Visit: Payer: BC Managed Care – PPO | Admitting: Cardiology

## 2023-03-11 NOTE — Progress Notes (Unsigned)
Cardiology Office Note:  .   Date:  03/12/2023  ID:  Gloria Dalton, DOB 11/04/1970, MRN [id number] PCP: Alinda Deem, MD  [facility] Providers Cardiologist:  Garwin Brothers, MD    History of Present Illness: .   Gloria Dalton is a 53 y.o. female with past medical history of hypertension, OSA on CPAP, obesity, dyslipidemia bipolar.  12/10/2019 echo EF 60-65%, mild LVH, grade 1 DD  Most recently evaluated by Dr. Tomie China on 02/01/2022, she was stable from a cardiac perspective, advised to lose weight and increase her physical activity, follow-up in 12 months.  She presents today for follow-up of her hypertension.  She has been doing good from a cardiac perspective, she offers no formal complaints.  She has been try to make dietary changes in order to lose weight, she is down approximately 24 pounds.  She is wearing her CPAP, overall just feels like she has a lot more energy.  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  When she is on, she is very active with the residents. She denies chest pain, palpitations, dyspnea, pnd, orthopnea, n, v, dizziness, syncope, edema, weight gain, or early satiety.   ROS: Review of Systems  All other systems reviewed and are negative.    Studies Reviewed: Marland Kitchen   EKG Interpretation Date/Time:  Tuesday March 12 2023 11:19:30 EST Ventricular Rate:  79 PR Interval:  210 QRS Duration:  78 QT Interval:  376 QTC Calculation: 431 R Axis:   10  Text Interpretation: Sinus rhythm with 1st degree A-V block Otherwise normal ECG No previous ECGs available Confirmed by Wallis Bamberg [phone]) on 03/12/2023 12:19:11 PM    Cardiac Studies & Procedures      ECHOCARDIOGRAM  ECHOCARDIOGRAM COMPLETE 12/10/2019  Narrative ECHOCARDIOGRAM REPORT    Patient Name:   Gloria Dalton Date of Exam: 12/10/2019 Medical Rec #:  [id number]       Height:       65.0 in Accession #:    [id number]      Weight:       261.4 lb Date of Birth:   August 27, 1970       BSA:          2.217 m Patient Age:    49 years        BP:           116/62 mmHg Patient Gender: F               HR:           65 bpm. [facility]   Procedure: 2D Echo  Indications:    Cardiac murmur [R01.1 (ICD-10-CM)]  History:        Patient has no prior history of Echocardiogram examinations. Signs/Symptoms:Obesity, morbid; Risk Factors:Edema, Dyslipidemia and Hypertension.  Sonographer:    Louie Boston Referring Phys: [id number] KARDIE TOBB  IMPRESSIONS   1. Left ventricular ejection fraction, by estimation, is 60 to 65%. The left ventricle has normal function. The left ventricle has no regional wall motion abnormalities. There is mild left ventricular hypertrophy. Left ventricular diastolic parameters are consistent with Grade I diastolic dysfunction (impaired relaxation). 2. Right ventricular systolic function is normal. The right ventricular size is normal. There is normal pulmonary artery systolic pressure. 3. The mitral valve is normal in structure. No evidence of mitral valve regurgitation. No evidence of mitral stenosis. 4. The aortic valve is normal in structure. Aortic valve regurgitation is  not visualized. No aortic stenosis is present. 5. The inferior vena cava is normal in size with greater than 50% respiratory variability, suggesting right atrial pressure of 3 mmHg.  FINDINGS Left Ventricle: Left ventricular ejection fraction, by estimation, is 60 to 65%. The left ventricle has normal function. The left ventricle has no regional wall motion abnormalities. The left ventricular internal cavity size was normal in size. There is mild left ventricular hypertrophy. Left ventricular diastolic parameters are consistent with Grade I diastolic dysfunction (impaired relaxation).  Right Ventricle: The right ventricular size is normal. No increase in right ventricular wall thickness. Right ventricular systolic function is normal. There is normal pulmonary  artery systolic pressure. The tricuspid regurgitant velocity is 2.31 m/s, and with an assumed right atrial pressure of 3 mmHg, the estimated right ventricular systolic pressure is 24.3 mmHg.  Left Atrium: Left atrial size was normal in size.  Right Atrium: Right atrial size was normal in size.  Pericardium: There is no evidence of pericardial effusion.  Mitral Valve: The mitral valve is normal in structure. No evidence of mitral valve regurgitation. No evidence of mitral valve stenosis.  Tricuspid Valve: The tricuspid valve is normal in structure. Tricuspid valve regurgitation is not demonstrated. No evidence of tricuspid stenosis.  Aortic Valve: The aortic valve is normal in structure. Aortic valve regurgitation is not visualized. No aortic stenosis is present.  Pulmonic Valve: The pulmonic valve was normal in structure. Pulmonic valve regurgitation is not visualized. No evidence of pulmonic stenosis.  Aorta: The aortic root is normal in size and structure.  Venous: The inferior vena cava is normal in size with greater than 50% respiratory variability, suggesting right atrial pressure of 3 mmHg.  IAS/Shunts: No atrial level shunt detected by color flow Doppler.   LEFT VENTRICLE PLAX 2D LVIDd:         3.40 cm  Diastology LVIDs:         2.60 cm  LV e' medial:    6.53 cm/s LV PW:         1.20 cm  LV E/e' medial:  19.6 LV IVS:        1.10 cm  LV e' lateral:   9.03 cm/s LVOT diam:     1.90 cm  LV E/e' lateral: 14.2 LV SV:         82 LV SV Index:   37 LVOT Area:     2.84 cm   RIGHT VENTRICLE             IVC RV S prime:     11.60 cm/s  IVC diam: 1.50 cm TAPSE (M-mode): 2.3 cm  LEFT ATRIUM             Index       RIGHT ATRIUM           Index LA diam:        4.30 cm 1.94 cm/m  RA Area:     11.80 cm LA Vol (A2C):   41.9 ml 18.90 ml/m RA Volume:   25.90 ml  11.68 ml/m LA Vol (A4C):   51.8 ml 23.37 ml/m LA Biplane Vol: 47.2 ml 21.29 ml/m AORTIC VALVE LVOT Vmax:   139.00  cm/s LVOT Vmean:  94.400 cm/s LVOT VTI:    0.288 m  AORTA Ao Root diam: 3.20 cm Ao Asc diam:  3.30 cm  MITRAL VALVE                TRICUSPID VALVE MV Area (PHT): 3.48  cm     TR Peak grad:   21.3 mmHg MV Decel Time: 218 msec     TR Vmax:        231.00 cm/s MV E velocity: 128.00 cm/s MV A velocity: 135.00 cm/s  SHUNTS MV E/A ratio:  0.95         Systemic VTI:  0.29 m Systemic Diam: 1.90 cm  Gloria Balsam MD Electronically signed by Gloria Balsam MD Signature Date/Time: 12/10/2019/5:02:37 PM    Final             Risk Assessment/Calculations:             Physical Exam:   VS:  BP 120/74   Pulse 79   Ht 5\' 5"  (1.651 m)   Wt 233 lb (105.7 kg)   SpO2 99%   BMI 38.77 kg/m    Wt Readings from Last 3 Encounters:  03/12/23 233 lb (105.7 kg)  05/22/22 237 lb 9.6 oz (107.8 kg)  04/12/22 246 lb 12.8 oz (111.9 kg)    GEN: Well nourished, well developed in no acute distress NECK: No JVD; No carotid bruits CARDIAC: RRR, no appreciable murmur, rubs, gallops RESPIRATORY:  Clear to auscultation without rales, wheezing or rhonchi  ABDOMEN: Soft, non-tender, non-distended EXTREMITIES:  No edema; No deformity   ASSESSMENT AND PLAN: .   Hypertension-her blood pressure is well-controlled today at 120/74, continue valsartan-HCTZ 160-12.5 mg daily.  Dyslipidemia-was recently checked by her PCP however do not have records available to review, will request them from their office, continue Crestor 20 mg daily.  OSA on CPAP-compliant  Obesity-she is actively working on making lifestyle modifications and she is having good success, she is down approximately 24 pounds, encouraged her to continue with her weight loss.       Dispo: Request recent lab work from PCP, follow-up with general cardiology in 1 year.  Signed, Flossie Dibble, NP

## 2023-03-12 ENCOUNTER — Ambulatory Visit: Payer: BC Managed Care – PPO | Attending: Cardiology | Admitting: Cardiology

## 2023-03-12 ENCOUNTER — Encounter: Payer: Self-pay | Admitting: Cardiology

## 2023-03-12 VITALS — BP 120/74 | HR 79 | Ht 65.0 in | Wt 233.0 lb

## 2023-03-12 DIAGNOSIS — G4733 Obstructive sleep apnea (adult) (pediatric): Secondary | ICD-10-CM | POA: Diagnosis not present

## 2023-03-12 DIAGNOSIS — I10 Essential (primary) hypertension: Secondary | ICD-10-CM | POA: Diagnosis not present

## 2023-03-12 DIAGNOSIS — E782 Mixed hyperlipidemia: Secondary | ICD-10-CM

## 2023-03-12 DIAGNOSIS — R920 Mammographic microcalcification found on diagnostic imaging of breast: Secondary | ICD-10-CM | POA: Diagnosis not present

## 2023-03-12 DIAGNOSIS — R928 Other abnormal and inconclusive findings on diagnostic imaging of breast: Secondary | ICD-10-CM | POA: Diagnosis not present

## 2023-03-12 NOTE — Patient Instructions (Signed)
Medication Instructions:  Your physician recommends that you continue on your current medications as directed. Please refer to the Current Medication list given to you today.  *If you need a refill on your cardiac medications before your next appointment, please call your pharmacy*   Lab Work: NONE If you have labs (blood work) drawn today and your tests are completely normal, you will receive your results only by: MyChart Message (if you have MyChart) OR A paper copy in the mail If you have any lab test that is abnormal or we need to change your treatment, we will call you to review the results.   Testing/Procedures: NONE   Follow-Up: At [facility], you and your health needs are our priority.  As part of our continuing mission to provide you with exceptional heart care, we have created designated Provider Care Teams.  These Care Teams include your primary Cardiologist (physician) and Advanced Practice Providers (APPs -  Physician Assistants and Nurse Practitioners) who all work together to provide you with the care you need, when you need it.  We recommend signing up for the patient portal called "MyChart".  Sign up information is provided on this After Visit Summary.  MyChart is used to connect with patients for Virtual Visits (Telemedicine).  Patients are able to view lab/test results, encounter notes, upcoming appointments, etc.  Non-urgent messages can be sent to your provider as well.   To learn more about what you can do with MyChart, go to [email or web address].    Your next appointment:   1 year(s)  Provider:   Rajan Revankar, MD    Other Instructions   

## 2023-03-26 DIAGNOSIS — F209 Schizophrenia, unspecified: Secondary | ICD-10-CM | POA: Diagnosis not present

## 2023-03-27 DIAGNOSIS — Z0000 Encounter for general adult medical examination without abnormal findings: Secondary | ICD-10-CM | POA: Diagnosis not present

## 2023-03-27 DIAGNOSIS — Z23 Encounter for immunization: Secondary | ICD-10-CM | POA: Diagnosis not present

## 2023-04-08 ENCOUNTER — Ambulatory Visit (INDEPENDENT_AMBULATORY_CARE_PROVIDER_SITE_OTHER): Payer: Self-pay | Admitting: *Deleted

## 2023-04-08 DIAGNOSIS — J309 Allergic rhinitis, unspecified: Secondary | ICD-10-CM | POA: Diagnosis not present

## 2023-04-11 DIAGNOSIS — G4733 Obstructive sleep apnea (adult) (pediatric): Secondary | ICD-10-CM | POA: Diagnosis not present

## 2023-04-12 DIAGNOSIS — N6321 Unspecified lump in the left breast, upper outer quadrant: Secondary | ICD-10-CM | POA: Diagnosis not present

## 2023-04-12 DIAGNOSIS — R928 Other abnormal and inconclusive findings on diagnostic imaging of breast: Secondary | ICD-10-CM | POA: Diagnosis not present

## 2023-04-12 DIAGNOSIS — R921 Mammographic calcification found on diagnostic imaging of breast: Secondary | ICD-10-CM | POA: Diagnosis not present

## 2023-04-12 DIAGNOSIS — N6012 Diffuse cystic mastopathy of left breast: Secondary | ICD-10-CM | POA: Diagnosis not present

## 2023-04-23 DIAGNOSIS — F209 Schizophrenia, unspecified: Secondary | ICD-10-CM | POA: Diagnosis not present

## 2023-05-06 ENCOUNTER — Ambulatory Visit (INDEPENDENT_AMBULATORY_CARE_PROVIDER_SITE_OTHER): Payer: Self-pay

## 2023-05-06 DIAGNOSIS — J309 Allergic rhinitis, unspecified: Secondary | ICD-10-CM

## 2023-05-07 ENCOUNTER — Telehealth: Payer: Self-pay | Admitting: Cardiovascular Disease

## 2023-05-07 NOTE — Telephone Encounter (Signed)
 Patient identification verified by 2 forms. Gloria Rail, RN    Called and spoke to patient  Patient states:   -she was completing E-check in form   -she has questions about form that said she will receive a physician and facility bill   -would like to know why she will be billed twice   -she has a copay of $40   -would like to confirm if we have correct address  Informed patient:   -unsure regarding billing, message sent to billing department for assistance  Patient confirmed address on file  Patient awaiting update regarding billing concern  Informed patient message sent

## 2023-05-07 NOTE — Telephone Encounter (Signed)
 Pt has questions regarding consent forms related to online check in, requesting cb

## 2023-05-08 ENCOUNTER — Encounter: Payer: Self-pay | Admitting: Cardiovascular Disease

## 2023-05-08 ENCOUNTER — Ambulatory Visit: Payer: BC Managed Care – PPO | Attending: Cardiovascular Disease | Admitting: Cardiovascular Disease

## 2023-05-08 VITALS — BP 118/72 | HR 67 | Ht 65.0 in | Wt 235.6 lb

## 2023-05-08 DIAGNOSIS — I10 Essential (primary) hypertension: Secondary | ICD-10-CM | POA: Diagnosis not present

## 2023-05-08 DIAGNOSIS — E782 Mixed hyperlipidemia: Secondary | ICD-10-CM | POA: Diagnosis not present

## 2023-05-08 DIAGNOSIS — G4733 Obstructive sleep apnea (adult) (pediatric): Secondary | ICD-10-CM

## 2023-05-08 NOTE — Patient Instructions (Signed)
 Medication Instructions:  No medication changes were made during today's visit.  *If you need a refill on your cardiac medications before your next appointment, please call your pharmacy*   Lab Work: No labs were ordered during today's visit.  If you have labs (blood work) drawn today and your tests are completely normal, you will receive your results only by: MyChart Message (if you have MyChart) OR A paper copy in the mail If you have any lab test that is abnormal or we need to change your treatment, we will call you to review the results.   Testing/Procedures: No labs were ordered during today's visit.    Follow-Up: At [facility], you and your health needs are our priority.  As part of our continuing mission to provide you with exceptional heart care, we have created designated Provider Care Teams.  These Care Teams include your primary Cardiologist (physician) and Advanced Practice Providers (APPs -  Physician Assistants and Nurse Practitioners) who all work together to provide you with the care you need, when you need it.  We recommend signing up for the patient portal called "MyChart".  Sign up information is provided on this After Visit Summary.  MyChart is used to connect with patients for Virtual Visits (Telemedicine).  Patients are able to view lab/test results, encounter notes, upcoming appointments, etc.  Non-urgent messages can be sent to your provider as well.   To learn more about what you can do with MyChart, go to [email or web address].    Your next appointment:    As needed for sleep  Provider:   Dr. Armanda Magic     Other Instructions [facility]  [address] [place], [place] [address] [address] [phone]       1st Floor: - Lobby - Registration  - Pharmacy  - Lab - Cafe   2nd Floor: - PV Lab - Diagnostic Testing (echo, CT, nuclear med)   3rd Floor: - Vacant   4th Floor: - TCTS (cardiothoracic surgery) -  [facility] Clinic - Structural Heart Clinic - Vascular Surgery  - Vascular Ultrasound   5th Floor: - [facility] (general and EP) - Clinical Pharmacy for coumadin, hypertension, lipid, weight-loss medications, and med management appointments      Valet parking services will be available as well.       If you have any questions or concerns regarding your c-pap, bi-pap or sleep accessories, please contact Brandie Rorie at [phone].

## 2023-05-08 NOTE — Progress Notes (Signed)
 Patient seen in office and pressure setting changed in AirView, per Dr. Tresa Endo. Pressure changed to: Confirm sending these new settings to device [phone]: Set Therapy mode to AutoSet Set Min Pressure to 11.0 cmH2O Set Max Pressure to 20.0 cmH2O Set Response to Standard Set EPR to Fulltime Set EPR level to 2 cmH2O Set Ramp enable to On Set Ramp time to 5 min Set Start pressure to 6.0 cmH2O

## 2023-05-08 NOTE — Progress Notes (Addendum)
 Cardiology Office Note    Date:  05/13/2023   ID:  Gloria Dalton, DOB 02/26/1970, MRN [id number]  PCP:  Alinda Deem, MD  Cardiologist:  Nicki Guadalajara, MD   13 month F/U  sleep evaluation initially referred by Dr. Alinda Deem at [facility] for sleep apnea   History of Present Illness:  Gloria Dalton is a 53 y.o. female who is referred through the courtesy of Dr. Belva Crome for evaluation of sleep apnea.  I saw her for my initial sleep consultation on April 16, 2022.  She presents for follow-up evaluation.  Gloria Dalton has a history of hypertension, hyperlipidemia, as well as morbid obesity.  Apparently, on September 30, 2019 she was referred to [facility] sleep center in [facility] and underwent a sleep study due to concerns for obstructive sleep apnea.  I was finally able to get a copy of this report which revealed severe sleep apnea with an AHI of 56.8/h.  Moderate oxygen desaturation to a nadir of 80%.  She apparently received a ResMed air sense 10 AutoSet unit on March 15, 2020.  She has never really had follow-up with reference to her sleep apnea consider study and her initial set up was a pressure range of 4 to 20 cm of water.  She does not understand is actually what sleep apnea is.  Prior to initiating therapy she had symptoms of snoring, frequent awakenings, would awaken gasping for breath, and had witnessed apnea.  [employer] is her DME company.  She was recently evaluated by Dr. Belva Crome at [facility] and is now referred to me for sleep consultation and evaluation.  She has a history of bipolar disorder and is on haloperidol.  For hypertension she has been on valsartan HCT 320/25 mg.  She is on rosuvastatin 20 mg for hyperlipidemia.  She takes Allegra as needed for allergies.  She takes allergy shots every other week.  Since initiating CPAP therapy, she is unaware of breakthrough snoring.  Her sleep is more restorative.  She denies any  significant nocturia.  An Epworth Sleepiness Scale score was recalculated in the office today and this endorsed at 0 argue against daytime sleepiness.  During my initial evaluation I had an extensive discussion with her regarding untreated sleep apnea with particular attention to its effects on hypertension, nocturnal arrhythmias, increased sympathetic tone contribute to palpitations as well as increased atrial fibrillation risk.  I discussed potential negative effects on insulin resistance, increased development of nocturnal GERD as well as increased inflammation.  In addition I would discuss nocturnal hypoxemia potentially contributing to nocturnal ischemia both of the cardiac and cerebrovascular circulation.  I discussed the importance of weight loss and to use therapy optimally for at least 7 to 9 hours if at all possible.  Since I saw her, she has been evaluated by Oneida Arenas, NP in January 2025.  She has continued to wear her CPAP has noticed improvement.  A download was obtained in the office today from February 14 to May 04, 2023.  This confirms 100% use with average use of 7 hours and 57 minutes.  She has a ResMed air sense 10 AutoSet for her unit currently set at a pressure range of 9 to 20 cm.  AHI is excellent at 0.6.  Her 95th percentile pressure is 18.1 with maximum average pressure at 19.1.  There is no leak.  She presents for evaluation.   Past Medical History:  Diagnosis Date  Allergic rhinitis    Allergies    BMI 40.0-44.9, adult (HCC)    Cardiac murmur    Essential hypertension    High blood pressure    High cholesterol    Hyperglycemia, unspecified    Hyperlipidemia    Hypertension    Localized edema    Obesity, morbid (HCC)    Sleep apnea     Past Surgical History:  Procedure Laterality Date   BREAST LUMPECTOMY     PARTIAL HYSTERECTOMY     age 82   TONSILLECTOMY     UTERINE FIBROID SURGERY      Current Medications: Outpatient Medications Prior to Visit   Medication Sig Dispense Refill   Calcium Carbonate-Vitamin D (CALCIUM-VITAMIN D PO) Take by mouth daily.     EPINEPHrine 0.3 mg/0.3 mL IJ SOAJ injection Use as directed for life threatening allergic reactions (Patient taking differently: Inject 0.3 mg into the muscle as needed for anaphylaxis. Use as directed for life threatening allergic reactions) 2 each 2   fexofenadine (ALLEGRA) 180 MG tablet Take 180 mg by mouth daily as needed for allergies or rhinitis.     haloperidol (HALDOL) 2 MG tablet Take 2 mg by mouth as needed.     haloperidol decanoate (HALDOL DECANOATE) 50 MG/ML injection Inject 50 mg into the muscle every 30 (thirty) days.     rosuvastatin (CRESTOR) 20 MG tablet Take 20 mg by mouth at bedtime.     valsartan-hydrochlorothiazide (DIOVAN-HCT) 320-25 MG tablet Take 1 tablet by mouth daily.     CALCIUM PO Take 1 tablet by mouth daily.     Cholecalciferol (D3-1000 PO) Take 1,000 Units by mouth daily.     No facility-administered medications prior to visit.     Allergies:   Aspirin, Benicar [olmesartan], Fluticasone propionate, Nasonex [mometasone], and Penicillins   Social History   Socioeconomic History   Marital status: Single    Spouse name: Not on file   Number of children: Not on file   Years of education: Not on file   Highest education level: Not on file  Occupational History   Not on file  Tobacco Use   Smoking status: Never    Passive exposure: Never   Smokeless tobacco: Never  Vaping Use   Vaping status: Never Used  Substance and Sexual Activity   Alcohol use: Never   Drug use: Never   Sexual activity: Not on file  Other Topics Concern   Not on file  Social History Narrative   Not on file   Social Drivers of Health   Financial Resource Strain: Not on File (01/13/2019)   Received from [employer], [employer]    Financial Resource Strain: 0  Food Insecurity: Not on File (01/13/2019)   Received from [place], [place]   Food Insecurity     Food: 0  Transportation Needs: Not on File (01/13/2019)   Received from [employer], [employer] Needs    Transportation: 0  Physical Activity: Not on File (01/13/2019)   Received from [employer], [place]   Physical Activity    Physical Activity: 0  Stress: Not on File (01/13/2019)   Received from [facility], [place]   Stress    Stress: 0  Social Connections: Not on File (01/13/2019)   Received from [place], [place]   Social Connections    Social Connections and Isolation: 0    Social history is notable that she was born in [place].  He is single with no children.  She has a sister age 3.  She works with disabled residents.  There is no tobacco or alcohol history.  She does not routinely exercise.  Family History:  The patient's family history includes Breast cancer in her maternal aunt, maternal aunt, and mother; Heart attack in her father and maternal grandfather; High blood pressure in her father and paternal grandfather; Stroke in her maternal grandmother.  Her mother is living and has breast cancer.  Father died at age 12 with a heart attack.  Her sister is 26.  ROS General: Negative; No fevers, chills, or night sweats;  HEENT: Negative; No changes in vision or hearing, sinus congestion, difficulty swallowing Pulmonary: Negative; No cough, wheezing, shortness of breath, hemoptysis Cardiovascular: Negative; No chest pain, presyncope, syncope, palpitations GI: Negative; No nausea, vomiting, diarrhea, or abdominal pain GU: Negative; No dysuria, hematuria, or difficulty voiding Musculoskeletal: Negative; no myalgias, joint pain, or weakness Hematologic/Oncology: Negative; no easy bruising, bleeding Endocrine: Negative; no heat/cold intolerance; no diabetes Neuro: Negative; no changes in balance, headaches Skin: Negative; No rashes or skin lesions Psychiatric: She tells me she has now been diagnosed with schizophrenia and not bipolar disorder Sleep: See HPI  An Epworth sleepiness  scale score was calculated in the office today, May 08, 2023 which endorsed at 0 argue against any daytime sleepiness.  Other comprehensive 14 point system review is negative.   PHYSICAL EXAM:   VS:  BP 118/72   Pulse 67   Ht 5\' 5"  (1.651 m)   Wt 235 lb 9.6 oz (106.9 kg)   SpO2 97%   BMI 39.21 kg/m     Repeat blood pressure by me was 102/70  Wt Readings from Last 3 Encounters:  05/08/23 235 lb 9.6 oz (106.9 kg)  03/12/23 233 lb (105.7 kg)  05/22/22 237 lb 9.6 oz (107.8 kg)    General: Alert, oriented, no distress.  Skin: normal turgor, no rashes, warm and dry HEENT: Normocephalic, atraumatic. Pupils equal round and reactive to light; sclera anicteric; extraocular muscles intact;  Nose without nasal septal hypertrophy Mouth/Parynx benign; Mallinpatti scale 4 Neck: No JVD, no carotid bruits; normal carotid upstroke Lungs: clear to ausculatation and percussion; no wheezing or rales Chest wall: without tenderness to palpitation Heart: PMI not displaced, RRR, s1 s2 normal, 1/6 systolic murmur, no diastolic murmur, no rubs, gallops, thrills, or heaves Abdomen: soft, nontender; no hepatosplenomehaly, BS+; abdominal aorta nontender and not dilated by palpation. Back: no CVA tenderness Pulses 2+ Musculoskeletal: full range of motion, normal strength, no joint deformities Extremities: no clubbing cyanosis or edema, Homan's sign negative  Neurologic: grossly nonfocal; Cranial nerves grossly wnl Psychologic: Normal mood and affect   Studies/Labs Reviewed:    EKG Interpretation Date/Time:  Wednesday May 08 2023 11:45:42 EDT Ventricular Rate:  67 PR Interval:  208 QRS Duration:  80 QT Interval:  386 QTC Calculation: 407 R Axis:   13  Text Interpretation: Normal sinus rhythm Normal ECG When compared with ECG of 12-Mar-2023 11:19, No significant change was found Confirmed by Nicki Guadalajara ([id number]) on 05/13/2023 5:26:33 PM    April 12, 2022 ECG (independently read by me): NSR  at 71, mild sinus arrythmia  Recent Labs:    02/27/2023   12:00 AM  BMP  Calcium 9.8         This result is from an external source.         No data to display              No data  to display         No results found for: "MCV" No results found for: "TSH" No results found for: "HGBA1C"   BNP No results found for: "BNP"  ProBNP No results found for: "PROBNP"   Lipid Panel  No results found for: "CHOL", "TRIG", "HDL", "CHOLHDL", "VLDL", "LDLCALC", "LDLDIRECT", "LABVLDL"   RADIOLOGY: No results found.   Additional studies/ records that were reviewed today include:   Reviewed the records from [place] sleep center and specifically the sleep study of September 30, 2019.  Records of Alinda Deem at [facility] family medicine were reviewed.   ASSESSMENT:    1. Essential hypertension   2. Obstructive sleep apnea syndrome on CPAP   3. Mixed hyperlipidemia   4. Morbid obesity (HCC)     PLAN:  Gloria Dalton is a very pleasant 53 year old female who has a history of hypertension currently  with valsartan HCT 320/25 mg.  She has been on Haldol for bipolar disorder.  However, she tells me today that she is now felt to have schizophrenia and not bipolar disorder.  She has a history of hyperlipidemia on rosuvastatin 20 mg daily.  Due to concerns for obstructive sleep apnea with symptoms of snoring, awakening gasping for breath, witnessed apnea, nonrestorative sleep, she was referred for a sleep study which was done in [place] at the [facility] sleep center on September 30, 2019.  She was found to have severe sleep apnea with an AHI of 56.8/h.  O2 nadir was 80%.  She apparently was started on AutoPap therapy with a pressure range of 4 to 20 cm of water.  She never had any follow-up sleep evaluation.  I saw her for my initial evaluation when she was referred by Dr. Belva Crome.  During that evaluation I had an extensive discussion with her regarding sleep apnea and its  potential adverse cardiovascular consequences as well outlined in my prior note.  Over the past year, she has continued to use CPAP very reliably.  I obtained a download today from February 14 through May 04, 2023.  Usage is 100%.  Average use is just shy of 8 hours at 7 hours and 57 minutes.  Her unit is set at a pressure range of 9 to 20 cm.  AHI is excellent at 0.6.  She is requiring near maximal pressure with 95th percentile pressure 18.1 with maximum average pressure at 19.1.  With her significant pressure requirement, I am changing her start pressure from 9 to 11 cm of water and she will continue with a range of 11 to 20 cm.  Her blood pressure today is stable.  She has lost some weight from when I had initially seen her with weight down to 235 today giving a BMI just under 40, currently at 39.21 she tells me since 2022, she has lost approximately 4 clinically she is doing well.  She will continue with current blood pressure regimen.  She followed by Dr. Belva Crome for primary care.  Recent lipid studies from January 2025 are improved with LDL improving down to 93 from previous 142.  Total cholesterol was 164 with triglycerides 70 and HDL 57.  I discussed with her my plans for upcoming retirement this year.  Clinically she is doing well from a sleep apnea perspective.  Her machine set up date was March 15, 2020.  She will continue current therapy.  If sleep problems arise, follow-up with Dr. Mayford Knife  .   Medication Adjustments/Labs and Tests Ordered: Current medicines are  reviewed at length with the patient today.  Concerns regarding medicines are outlined above.  Medication changes, Labs and Tests ordered today are listed in the Patient Instructions below. Patient Instructions  Medication Instructions:  No medication changes were made during today's visit.  *If you need a refill on your cardiac medications before your next appointment, please call your pharmacy*   Lab Work: No labs were ordered  during today's visit.  If you have labs (blood work) drawn today and your tests are completely normal, you will receive your results only by: MyChart Message (if you have MyChart) OR A paper copy in the mail If you have any lab test that is abnormal or we need to change your treatment, we will call you to review the results.   Testing/Procedures: No labs were ordered during today's visit.    Follow-Up: At [facility], you and your health needs are our priority.  As part of our continuing mission to provide you with exceptional heart care, we have created designated Provider Care Teams.  These Care Teams include your primary Cardiologist (physician) and Advanced Practice Providers (APPs -  Physician Assistants and Nurse Practitioners) who all work together to provide you with the care you need, when you need it.  We recommend signing up for the patient portal called "MyChart".  Sign up information is provided on this After Visit Summary.  MyChart is used to connect with patients for Virtual Visits (Telemedicine).  Patients are able to view lab/test results, encounter notes, upcoming appointments, etc.  Non-urgent messages can be sent to your provider as well.   To learn more about what you can do with MyChart, go to [email or web address].    Your next appointment:    As needed for sleep  Provider:   Dr. Armanda Magic     Other Instructions [facility]  [address] [place], [place] [address] [address] [phone]       1st Floor: - Lobby - Registration  - Pharmacy  - Lab - Cafe   2nd Floor: - PV Lab - Diagnostic Testing (echo, CT, nuclear med)   3rd Floor: - Vacant   4th Floor: - TCTS (cardiothoracic surgery) - AFib Clinic - Structural Heart Clinic - Vascular Surgery  - Vascular Ultrasound   5th Floor: - HeartCare Cardiology (general and EP) - Clinical Pharmacy for coumadin, hypertension, lipid, weight-loss medications, and med  management appointments      Valet parking services will be available as well.       If you have any questions or concerns regarding your c-pap, bi-pap or sleep accessories, please contact Brandie Rorie at [phone].       Signed, Nicki Guadalajara, MD, [facility], ABSM Diplomate, American Board of Sleep Medicine  05/13/2023 5:33 PM    [facility] [address], [address], [place], [place]  [address] Phone: [phone]

## 2023-05-13 ENCOUNTER — Encounter: Payer: Self-pay | Admitting: Cardiovascular Disease

## 2023-05-13 NOTE — Addendum Note (Signed)
 Addended by: Nicki Guadalajara A on: 05/13/2023 05:34 PM   Modules accepted: Level of Service

## 2023-05-21 ENCOUNTER — Ambulatory Visit: Payer: BC Managed Care – PPO | Admitting: Allergy

## 2023-05-21 ENCOUNTER — Encounter: Payer: Self-pay | Admitting: Allergy

## 2023-05-21 VITALS — BP 122/78 | HR 72 | Resp 16 | Wt 234.6 lb

## 2023-05-21 DIAGNOSIS — H1013 Acute atopic conjunctivitis, bilateral: Secondary | ICD-10-CM | POA: Diagnosis not present

## 2023-05-21 DIAGNOSIS — F209 Schizophrenia, unspecified: Secondary | ICD-10-CM | POA: Diagnosis not present

## 2023-05-21 DIAGNOSIS — K219 Gastro-esophageal reflux disease without esophagitis: Secondary | ICD-10-CM

## 2023-05-21 DIAGNOSIS — J3089 Other allergic rhinitis: Secondary | ICD-10-CM | POA: Diagnosis not present

## 2023-05-21 NOTE — Progress Notes (Signed)
 Follow-up Note  RE: Gloria Dalton MRN: [id number] DOB: 25-Jul-1970 Date of Office Visit: 05/21/2023   History of present illness: Gloria Dalton is a 53 y.o. female presenting today for follow-up of allergic rhinitis conjunctivitis and reflux.  She was last seen in the office on 05/22/2022 myself. Discussed the use of AI scribe software for clinical note transcription with the patient, who gave verbal consent to proceed.  She has been receiving maintenance allergy shots once a month and she is tolerating this well.  She started on immunotherapy in 2021 however she did have a reduced schedule as she was not able to get into the office on a weekly basis.  She has done well with her symptom control and at this point in time is not noting any symptoms and no medication needs.  She does have access to Allegra and Pataday for as needed use.  She has access to her epinephrine device in case of allergic reaction.  She has experienced a weight loss since the last visit that she has been actively working on.  She does believe that his weight loss has helped with her reflux control.  She is not currently needing any reflux medications.  In February 2025, she underwent a left breast biopsy, which returned normal results. This is the only recent medical procedure she has undergone.   She is hopeful to get her braces removed in the follow-up this year.    Review of systems: 10pt ROS negative unless noted above in HPI  Past medical/social/surgical/family history have been reviewed and are unchanged unless specifically indicated below.  No changes  Medication List: Current Outpatient Medications  Medication Sig Dispense Refill   Calcium Carbonate-Vitamin D (CALCIUM-VITAMIN D PO) Take by mouth daily.     EPINEPHrine 0.3 mg/0.3 mL IJ SOAJ injection Use as directed for life threatening allergic reactions (Patient taking differently: Inject 0.3 mg into the muscle as needed for anaphylaxis. Use as  directed for life threatening allergic reactions) 2 each 2   fexofenadine (ALLEGRA) 180 MG tablet Take 180 mg by mouth daily as needed for allergies or rhinitis.     haloperidol (HALDOL) 2 MG tablet Take 2 mg by mouth as needed.     haloperidol decanoate (HALDOL DECANOATE) 50 MG/ML injection Inject 50 mg into the muscle every 30 (thirty) days.     rosuvastatin (CRESTOR) 20 MG tablet Take 20 mg by mouth at bedtime.     valsartan-hydrochlorothiazide (DIOVAN-HCT) 320-25 MG tablet Take 1 tablet by mouth daily.     No current facility-administered medications for this visit.     Known medication allergies: Allergies  Allergen Reactions   Aspirin Cough   Benicar [Olmesartan] Other (See Comments)    Abdominal pain/severe   Fluticasone Propionate    Nasonex [Mometasone] Cough   Penicillins Other (See Comments)    Patient unsure; gas/bloating     Physical examination: Blood pressure 122/78, pulse 72, resp. rate 16, weight 234 lb 9.6 oz (106.4 kg), SpO2 99%.  General: Alert, interactive, in no acute distress. HEENT: PERRLA, TMs pearly gray, turbinates non-edematous without discharge, post-pharynx non erythematous. Neck: Supple without lymphadenopathy. Lungs: Clear to auscultation without wheezing, rhonchi or rales. {no increased work of breathing. CV: Normal S1, S2 without murmurs. Abdomen: Nondistended, nontender. Skin: Warm and dry, without lesions or rashes. Extremities:  No clubbing, cyanosis or edema. Neuro:   Grossly intact.  Diagnositics/Labs: None today  Assessment and plan: Patient Instructions   Allergic rhinitis with conjunctivitis  -  continue avoidance measures for grass pollens, weed pollens, tree pollen, dust mite and mold  - continue allergen immunotherapy (allergy shots) per schedule at once a month. You have done well and will likely be able to discontinue shots next spring 2026.  - continue Allegra 180mg  daily as needed  - for itchy/watery/red eyes Pataday 1 drop  each eye daily as needed is a good option to continue as needed use  Reflux  - currently not having any issues    - continue your lifestyle modifications including not eating before bedtime and avoiding foods that trigger reflux  Follow-up in 12 months or sooner if needed  I appreciate the opportunity to take part in Gloria Dalton's care. Please do not hesitate to contact me with questions.  Sincerely,   Margo Aye, MD Allergy/Immunology [facility]

## 2023-05-21 NOTE — Patient Instructions (Addendum)
 Allergic rhinitis with conjunctivitis  - continue avoidance measures for grass pollens, weed pollens, tree pollen, dust mite and mold  - continue allergen immunotherapy (allergy shots) per schedule at once a month. You have done well and will likely be able to discontinue shots next spring.  - continue Allegra 180mg  daily as needed  - for itchy/watery/red eyes Pataday 1 drop each eye daily as needed is a good option to continue as needed use  Reflux  - currently not having any issues    - continue your lifestyle modifications including not eating before bedtime and avoiding foods that trigger reflux  Follow-up in 12 months or sooner if needed

## 2023-06-03 ENCOUNTER — Ambulatory Visit (INDEPENDENT_AMBULATORY_CARE_PROVIDER_SITE_OTHER): Payer: Self-pay | Admitting: *Deleted

## 2023-06-03 DIAGNOSIS — J309 Allergic rhinitis, unspecified: Secondary | ICD-10-CM

## 2023-06-11 NOTE — Telephone Encounter (Signed)
 [employer], no facility charge.

## 2023-06-18 DIAGNOSIS — F209 Schizophrenia, unspecified: Secondary | ICD-10-CM | POA: Diagnosis not present

## 2023-06-18 DIAGNOSIS — R921 Mammographic calcification found on diagnostic imaging of breast: Secondary | ICD-10-CM | POA: Diagnosis not present

## 2023-07-01 ENCOUNTER — Ambulatory Visit (INDEPENDENT_AMBULATORY_CARE_PROVIDER_SITE_OTHER): Payer: Self-pay | Admitting: *Deleted

## 2023-07-01 DIAGNOSIS — J309 Allergic rhinitis, unspecified: Secondary | ICD-10-CM | POA: Diagnosis not present

## 2023-07-02 ENCOUNTER — Telehealth: Payer: Self-pay | Admitting: Allergy

## 2023-07-02 NOTE — Telephone Encounter (Signed)
 Patient informed and verbalized understanding. She will keep us  updated

## 2023-07-02 NOTE — Telephone Encounter (Addendum)
 Patient states this morning, she woke up with her head feeling very "heavy" and it hurt. She also felt groggy. She checked her temperature and it was at 101.1. She did mention that she had a cough last night so she took Delsym. She's not sure if the cough medicine is what is making her feel groggy or if it could be her allergy  injection that she got yesterday. Throughout the day, she has felt a little better. She has had a couple of allergy  flares the past few weeks but she takes her allegra which helps.

## 2023-07-02 NOTE — Telephone Encounter (Signed)
 Patient requested for message response to be sent to her via MyChart as well for her records.

## 2023-07-03 NOTE — Telephone Encounter (Signed)
Informed of Dr. Padgett's message.  

## 2023-07-03 NOTE — Telephone Encounter (Signed)
 Patient called wanting to let Dr. Tempie Fee know that she has some congestion this morning but her head is feeling much better.

## 2023-07-09 DIAGNOSIS — J301 Allergic rhinitis due to pollen: Secondary | ICD-10-CM

## 2023-07-09 NOTE — Progress Notes (Signed)
 VIALS EXP 07-09-23

## 2023-07-10 DIAGNOSIS — J3089 Other allergic rhinitis: Secondary | ICD-10-CM

## 2023-07-16 ENCOUNTER — Telehealth: Payer: Self-pay | Admitting: *Deleted

## 2023-07-16 DIAGNOSIS — F209 Schizophrenia, unspecified: Secondary | ICD-10-CM | POA: Diagnosis not present

## 2023-07-16 NOTE — Telephone Encounter (Signed)
 Pt called and stated she is about to run out of her allegra that she purchased OTC and she is going to stop it now that she is feeling better. She will see how she does off of it. She will let us  know. Told her I would note in chart.

## 2023-07-25 NOTE — Telephone Encounter (Addendum)
 Patient called stating she is restarting Allegra because she seems to be having an allergy  flare again. She is wondering if the shots are not helping her anymore. She would like to let Dr. Tempie Fee know of this but also see if she may have other suggestions.   Please call HOME number and leave message.

## 2023-07-25 NOTE — Telephone Encounter (Signed)
Left a detailed message per patient's request

## 2023-07-29 ENCOUNTER — Ambulatory Visit (INDEPENDENT_AMBULATORY_CARE_PROVIDER_SITE_OTHER): Payer: Self-pay | Admitting: *Deleted

## 2023-07-29 DIAGNOSIS — J309 Allergic rhinitis, unspecified: Secondary | ICD-10-CM | POA: Diagnosis not present

## 2023-08-01 NOTE — Telephone Encounter (Addendum)
 Patient states she feels that she is having side effects from the Allegra. She noticed the Allegra was making her have constant coughing episodes, more congestion and made her heart race. She did not take it this morning and has not had as much cough. She is using Astepro  and it seems to help a little. Claritin does not work at all for her and Zyrtec makes her very sleepy.

## 2023-08-02 NOTE — Telephone Encounter (Signed)
 Left voicemail for patient to return call and MyChart message was sent as well.

## 2023-08-06 DIAGNOSIS — R921 Mammographic calcification found on diagnostic imaging of breast: Secondary | ICD-10-CM | POA: Diagnosis not present

## 2023-08-06 NOTE — Telephone Encounter (Signed)
 Gloria Dalton called and states that she has restarted her astepro  nasal spray to see if it helps. She will let us  know if it does not work.

## 2023-08-12 NOTE — Telephone Encounter (Signed)
 Patient called to say that she is doing well this week with her symptoms.

## 2023-08-13 DIAGNOSIS — F209 Schizophrenia, unspecified: Secondary | ICD-10-CM | POA: Diagnosis not present

## 2023-08-26 ENCOUNTER — Ambulatory Visit (INDEPENDENT_AMBULATORY_CARE_PROVIDER_SITE_OTHER): Payer: Self-pay | Admitting: *Deleted

## 2023-08-26 DIAGNOSIS — J309 Allergic rhinitis, unspecified: Secondary | ICD-10-CM

## 2023-08-28 DIAGNOSIS — E7800 Pure hypercholesterolemia, unspecified: Secondary | ICD-10-CM | POA: Diagnosis not present

## 2023-08-28 DIAGNOSIS — G4733 Obstructive sleep apnea (adult) (pediatric): Secondary | ICD-10-CM | POA: Diagnosis not present

## 2023-08-28 DIAGNOSIS — I10 Essential (primary) hypertension: Secondary | ICD-10-CM | POA: Diagnosis not present

## 2023-08-28 DIAGNOSIS — F209 Schizophrenia, unspecified: Secondary | ICD-10-CM | POA: Diagnosis not present

## 2023-09-10 DIAGNOSIS — F209 Schizophrenia, unspecified: Secondary | ICD-10-CM | POA: Diagnosis not present

## 2023-09-23 ENCOUNTER — Other Ambulatory Visit: Payer: Self-pay | Admitting: Family Medicine

## 2023-09-23 DIAGNOSIS — Z1231 Encounter for screening mammogram for malignant neoplasm of breast: Secondary | ICD-10-CM

## 2023-09-26 ENCOUNTER — Ambulatory Visit (INDEPENDENT_AMBULATORY_CARE_PROVIDER_SITE_OTHER): Payer: Self-pay | Admitting: *Deleted

## 2023-09-26 DIAGNOSIS — J309 Allergic rhinitis, unspecified: Secondary | ICD-10-CM

## 2023-10-08 DIAGNOSIS — F209 Schizophrenia, unspecified: Secondary | ICD-10-CM | POA: Diagnosis not present

## 2023-10-10 ENCOUNTER — Ambulatory Visit (INDEPENDENT_AMBULATORY_CARE_PROVIDER_SITE_OTHER): Payer: Self-pay | Admitting: *Deleted

## 2023-10-10 DIAGNOSIS — J309 Allergic rhinitis, unspecified: Secondary | ICD-10-CM

## 2023-10-24 ENCOUNTER — Ambulatory Visit (INDEPENDENT_AMBULATORY_CARE_PROVIDER_SITE_OTHER): Payer: Self-pay | Admitting: *Deleted

## 2023-10-24 DIAGNOSIS — J309 Allergic rhinitis, unspecified: Secondary | ICD-10-CM

## 2023-11-05 DIAGNOSIS — F209 Schizophrenia, unspecified: Secondary | ICD-10-CM | POA: Diagnosis not present

## 2023-12-03 DIAGNOSIS — F209 Schizophrenia, unspecified: Secondary | ICD-10-CM | POA: Diagnosis not present

## 2023-12-05 ENCOUNTER — Ambulatory Visit (INDEPENDENT_AMBULATORY_CARE_PROVIDER_SITE_OTHER): Payer: Self-pay | Admitting: *Deleted

## 2023-12-05 DIAGNOSIS — J309 Allergic rhinitis, unspecified: Secondary | ICD-10-CM

## 2023-12-31 DIAGNOSIS — F209 Schizophrenia, unspecified: Secondary | ICD-10-CM | POA: Diagnosis not present

## 2024-01-03 ENCOUNTER — Ambulatory Visit (HOSPITAL_BASED_OUTPATIENT_CLINIC_OR_DEPARTMENT_OTHER)
Admission: RE | Admit: 2024-01-03 | Discharge: 2024-01-03 | Disposition: A | Discharge status: Home / Self Care | Source: Ambulatory Visit | Attending: Family Medicine | Admitting: Family Medicine

## 2024-01-03 DIAGNOSIS — Z1231 Encounter for screening mammogram for malignant neoplasm of breast: Secondary | ICD-10-CM

## 2024-01-13 ENCOUNTER — Ambulatory Visit: Payer: Self-pay | Admitting: *Deleted

## 2024-01-13 DIAGNOSIS — J309 Allergic rhinitis, unspecified: Secondary | ICD-10-CM | POA: Diagnosis not present

## 2024-01-15 ENCOUNTER — Encounter (HOSPITAL_BASED_OUTPATIENT_CLINIC_OR_DEPARTMENT_OTHER): Payer: Self-pay | Admitting: Radiology

## 2024-01-15 ENCOUNTER — Ambulatory Visit (HOSPITAL_BASED_OUTPATIENT_CLINIC_OR_DEPARTMENT_OTHER)
Admission: RE | Admit: 2024-01-15 | Discharge: 2024-01-15 | Disposition: A | Source: Ambulatory Visit | Attending: Family Medicine | Admitting: Family Medicine

## 2024-01-15 DIAGNOSIS — Z1231 Encounter for screening mammogram for malignant neoplasm of breast: Secondary | ICD-10-CM

## 2024-01-28 DIAGNOSIS — F209 Schizophrenia, unspecified: Secondary | ICD-10-CM | POA: Diagnosis not present

## 2024-01-29 ENCOUNTER — Ambulatory Visit

## 2024-01-29 DIAGNOSIS — D239 Other benign neoplasm of skin, unspecified: Secondary | ICD-10-CM | POA: Diagnosis not present

## 2024-01-29 DIAGNOSIS — L304 Erythema intertrigo: Secondary | ICD-10-CM | POA: Diagnosis not present

## 2024-01-29 DIAGNOSIS — L906 Striae atrophicae: Secondary | ICD-10-CM

## 2024-01-29 DIAGNOSIS — D229 Melanocytic nevi, unspecified: Secondary | ICD-10-CM | POA: Diagnosis not present

## 2024-01-29 MED ORDER — NYSTATIN 100000 UNIT/GM EX OINT: 1.0000 | TOPICAL_OINTMENT | Freq: Two times a day (BID) | CUTANEOUS | 0 refills | Status: DC

## 2024-01-29 MED ORDER — TRIAMCINOLONE ACETONIDE 0.1 % EX OINT: TOPICAL_OINTMENT | CUTANEOUS | 2 refills | Status: AC

## 2024-01-29 MED ORDER — TRIAMCINOLONE ACETONIDE 0.1 % EX OINT: TOPICAL_OINTMENT | CUTANEOUS | 2 refills | Status: DC

## 2024-01-29 MED ORDER — NYSTATIN 100000 UNIT/GM EX OINT: 1.0000 | TOPICAL_OINTMENT | Freq: Two times a day (BID) | CUTANEOUS | 3 refills | Status: AC

## 2024-01-29 MED ORDER — TRIAMCINOLONE ACETONIDE 0.1 % EX OINT: TOPICAL_OINTMENT | CUTANEOUS | 5 refills | Status: DC

## 2024-01-29 NOTE — Progress Notes (Signed)
°  °  Subjective   Gloria Dalton is a 53 y.o. female who presents for the following: Lesion(s) of concern . Patient is new patient  Today patient reports: Area of concern on the left hand and arm  Review of Systems:    No other skin or systemic complaints except as noted in HPI or Assessment and Plan.  The following portions of the chart were reviewed this encounter and updated as appropriate: medications, allergies, medical history  Relevant Medical History:  n/a   Objective  (SKPE) Well appearing patient in no apparent distress; mood and affect are within normal limits. Examination was performed of the: Focused Exam of: Bilateral upper extremity    Examination notable for: striae bilateral axilla Multiple benign nevi - arms and hands  Dermatofibromas - arms  Intertrigo inframmamary  Examination limited by: Clothing and Patient deferred removal       Assessment & Plan  (SKAP)   BENIGN SKIN FINDINGS  - Dermatofibroma   - Nevus/Multiple Benign Nevi  - Striae bilateral axilla - Reassurance provided regarding the benign appearance of lesions noted on exam today; no treatment is indicated in the absence of symptoms/changes. - Reinforced importance of photoprotective strategies including liberal and frequent sunscreen use of a broad-spectrum SPF 30 or greater, use of protective clothing, and sun avoidance for prevention of cutaneous malignancy and photoaging.  Counseled patient on the importance of regular self-skin monitoring as well as routine clinical skin examinations as scheduled.   Intertrigo - chronic, flaring, not at goal  - Diagnosis, treatment options, prognosis, risk/ benefit, and side effects of treatment were discussed with the patient.  - Recommended keeping the area dry and clean - Recommended ZeoZorb  - Start nystatin (MYCOSTATIN) 100,000 unit/gram ointment; Apply topically Two (2) times a day.  - Start triamcinolone (KENALOG) 0.1 % cream; Apply topically Two (2)  times a day - when red or itchy    Procedures, orders, diagnosis for this visit:  MULTIPLE BENIGN NEVI   DERMATOFIBROMA   STRIAE   ERYTHEMA INTERTRIGO    Multiple benign nevi  Dermatofibroma  Striae  Erythema intertrigo  Other orders -     Triamcinolone Acetonide; Apply 1 gram twice daily to affected areas of skin. Stop once resolved and restart as needed for flares. Avoid use on face, armpits, groin unless otherwise indicated.  Dispense: 30 g; Refill: 5 -     Nystatin; Apply 1 Application topically 2 (two) times daily.  Dispense: 30 g; Refill: 0    Return to clinic: Return if symptoms worsen or fail to improve.  I, Emerick Ege, CMA am acting as scribe for Lauraine JAYSON Kanaris, MD.   Documentation: I have reviewed the above documentation for accuracy and completeness, and I agree with the above.  Lauraine JAYSON Kanaris, MD

## 2024-01-29 NOTE — Patient Instructions (Signed)

## 2024-02-10 ENCOUNTER — Ambulatory Visit: Admitting: *Deleted

## 2024-02-10 DIAGNOSIS — J309 Allergic rhinitis, unspecified: Secondary | ICD-10-CM | POA: Diagnosis not present

## 2024-03-02 ENCOUNTER — Telehealth: Payer: Self-pay | Admitting: *Deleted

## 2024-03-02 NOTE — Telephone Encounter (Signed)
 FYI: Gloria Dalton called today stating that she is stopping her immunotherapy. I did let her know that she has approximately 3 doses left in her current vials. She is not interested in finishing them. She states that she had blood work done recently and several of her levels were abnormal. She is concerned that the immunotherapy is causing this. I did tell her that that would be very unlikely but she is sure that it is. We will discontinue her immunotherapy at this time and she will keep her follow up appointment with Dr. Jeneal in April.

## 2024-03-11 ENCOUNTER — Other Ambulatory Visit: Payer: Self-pay

## 2024-03-12 ENCOUNTER — Ambulatory Visit: Admitting: Dermatology

## 2024-03-24 ENCOUNTER — Ambulatory Visit: Admitting: Cardiology

## 2024-04-02 ENCOUNTER — Ambulatory Visit: Admitting: Cardiology

## 2024-06-02 ENCOUNTER — Ambulatory Visit: Admitting: Allergy
